# Patient Record
Sex: Male | Born: 1960 | Race: White | Hispanic: No | Marital: Married | Smoking: Never smoker
Health system: Southern US, Community
[De-identification: ages and names within clinical notes are randomized; demographics above are authoritative.]

## PROBLEM LIST (undated history)

## (undated) DIAGNOSIS — Z9889 Other specified postprocedural states: Secondary | ICD-10-CM

## (undated) DIAGNOSIS — R112 Nausea with vomiting, unspecified: Secondary | ICD-10-CM

## (undated) DIAGNOSIS — I10 Essential (primary) hypertension: Secondary | ICD-10-CM

## (undated) DIAGNOSIS — M1990 Unspecified osteoarthritis, unspecified site: Secondary | ICD-10-CM

## (undated) DIAGNOSIS — G8929 Other chronic pain: Secondary | ICD-10-CM

## (undated) DIAGNOSIS — M549 Dorsalgia, unspecified: Secondary | ICD-10-CM

## (undated) DIAGNOSIS — K219 Gastro-esophageal reflux disease without esophagitis: Secondary | ICD-10-CM

## (undated) DIAGNOSIS — J302 Other seasonal allergic rhinitis: Secondary | ICD-10-CM

## (undated) HISTORY — PX: KNEE ARTHROSCOPY: SUR90

## (undated) HISTORY — PX: PLANTAR FASCIA SURGERY: SHX746

## (undated) HISTORY — PX: SHOULDER ARTHROSCOPY: SHX128

## (undated) HISTORY — PX: REPAIR TENDONS FOOT: SUR1209

## (undated) HISTORY — PX: WRIST ARTHROSCOPY: SHX838

---

## 2000-11-05 ENCOUNTER — Ambulatory Visit (HOSPITAL_COMMUNITY): Admission: RE | Admit: 2000-11-05 | Discharge: 2000-11-05 | Payer: Self-pay | Admitting: Pulmonary Disease

## 2000-11-06 ENCOUNTER — Encounter (HOSPITAL_COMMUNITY): Admission: RE | Admit: 2000-11-06 | Discharge: 2000-12-06 | Payer: Self-pay | Admitting: Pulmonary Disease

## 2000-11-27 ENCOUNTER — Encounter (HOSPITAL_COMMUNITY): Admission: RE | Admit: 2000-11-27 | Discharge: 2000-12-27 | Payer: Self-pay | Admitting: Pulmonary Disease

## 2000-12-05 ENCOUNTER — Ambulatory Visit (HOSPITAL_COMMUNITY): Admission: RE | Admit: 2000-12-05 | Discharge: 2000-12-05 | Payer: Self-pay | Admitting: Pulmonary Disease

## 2001-01-22 ENCOUNTER — Encounter: Payer: Self-pay | Admitting: Otolaryngology

## 2001-01-22 ENCOUNTER — Ambulatory Visit (HOSPITAL_COMMUNITY): Admission: RE | Admit: 2001-01-22 | Discharge: 2001-01-22 | Payer: Self-pay | Admitting: Otolaryngology

## 2001-02-12 ENCOUNTER — Encounter: Payer: Self-pay | Admitting: Otolaryngology

## 2001-02-12 ENCOUNTER — Ambulatory Visit (HOSPITAL_COMMUNITY): Admission: RE | Admit: 2001-02-12 | Discharge: 2001-02-12 | Payer: Self-pay | Admitting: Otolaryngology

## 2004-01-12 ENCOUNTER — Ambulatory Visit (HOSPITAL_COMMUNITY): Admission: RE | Admit: 2004-01-12 | Discharge: 2004-01-12 | Payer: Self-pay | Admitting: Orthopedic Surgery

## 2004-01-12 ENCOUNTER — Ambulatory Visit (HOSPITAL_BASED_OUTPATIENT_CLINIC_OR_DEPARTMENT_OTHER): Admission: RE | Admit: 2004-01-12 | Discharge: 2004-01-12 | Payer: Self-pay | Admitting: Orthopedic Surgery

## 2004-03-23 ENCOUNTER — Ambulatory Visit (HOSPITAL_COMMUNITY): Admission: RE | Admit: 2004-03-23 | Discharge: 2004-03-23 | Payer: Self-pay | Admitting: Pulmonary Disease

## 2004-04-13 ENCOUNTER — Ambulatory Visit: Payer: Self-pay | Admitting: Internal Medicine

## 2004-05-03 ENCOUNTER — Ambulatory Visit: Payer: Self-pay | Admitting: Internal Medicine

## 2004-05-03 ENCOUNTER — Ambulatory Visit (HOSPITAL_COMMUNITY): Admission: RE | Admit: 2004-05-03 | Discharge: 2004-05-03 | Payer: Self-pay | Admitting: Internal Medicine

## 2004-07-30 ENCOUNTER — Emergency Department (HOSPITAL_COMMUNITY): Admission: EM | Admit: 2004-07-30 | Discharge: 2004-07-31 | Payer: Self-pay | Admitting: *Deleted

## 2005-01-10 ENCOUNTER — Ambulatory Visit (HOSPITAL_COMMUNITY): Admission: RE | Admit: 2005-01-10 | Discharge: 2005-01-10 | Payer: Self-pay | Admitting: Orthopedic Surgery

## 2005-01-10 ENCOUNTER — Ambulatory Visit (HOSPITAL_BASED_OUTPATIENT_CLINIC_OR_DEPARTMENT_OTHER): Admission: RE | Admit: 2005-01-10 | Discharge: 2005-01-10 | Payer: Self-pay | Admitting: Orthopedic Surgery

## 2006-02-07 ENCOUNTER — Ambulatory Visit (HOSPITAL_BASED_OUTPATIENT_CLINIC_OR_DEPARTMENT_OTHER): Admission: RE | Admit: 2006-02-07 | Discharge: 2006-02-07 | Payer: Self-pay | Admitting: Orthopedic Surgery

## 2006-12-11 ENCOUNTER — Encounter: Admission: RE | Admit: 2006-12-11 | Discharge: 2006-12-11 | Payer: Self-pay | Admitting: Otolaryngology

## 2006-12-27 ENCOUNTER — Ambulatory Visit: Payer: Self-pay | Admitting: Gastroenterology

## 2007-01-01 ENCOUNTER — Ambulatory Visit: Payer: Self-pay | Admitting: Gastroenterology

## 2007-01-01 ENCOUNTER — Ambulatory Visit (HOSPITAL_COMMUNITY): Admission: RE | Admit: 2007-01-01 | Discharge: 2007-01-01 | Payer: Self-pay | Admitting: Gastroenterology

## 2007-01-01 ENCOUNTER — Encounter: Payer: Self-pay | Admitting: Gastroenterology

## 2007-01-03 ENCOUNTER — Ambulatory Visit: Payer: Self-pay | Admitting: Gastroenterology

## 2007-01-14 ENCOUNTER — Ambulatory Visit (HOSPITAL_COMMUNITY): Admission: RE | Admit: 2007-01-14 | Discharge: 2007-01-14 | Payer: Self-pay | Admitting: Pulmonary Disease

## 2010-05-30 NOTE — Op Note (Signed)
NAME:  [name], [name] NO.:  [id number]   MEDICAL RECORD NO.:  [id number]          PATIENT TYPE:  AMB   LOCATION:  DAY                           FACILITY:  [facility]   PHYSICIAN:  Kassie Mends, M.D.      DATE OF BIRTH:  [birth year]   DATE OF PROCEDURE:  01/01/2007  DATE OF DISCHARGE:                               OPERATIVE REPORT   PROCEDURE:  Esophagogastroduodenoscopy with cold forceps biopsy and  Bravo capsule placement.   INDICATION FOR EXAM:  [name] is a 50 year old male who was seen  by Dr. Karilyn Cota in 2006 for a chronic cough that did not respond to  antibiotics.  He was placed on Nexium 40 mg once a day with no  improvement of his symptoms and the cough eventually went away. He  continued to have intermittent episodes of cough.  He had an EGD with  Bravo capsule placement in April 2006.  The study was performed off of  medications.  On day one, he had 174 episodes of reflux with 172 minutes  of his esophagus having a pH of less than 4.  His reflux was occurring  primarily in the upright position.  With cough, he only had one minute  of pH of less than 4 in his esophagus.  His DeMeester score was 40.4 on  day one.  On day two, he had 61 episodes of reflux.  He had 37 minutes  in which his esophagus pH was less than 4.  He had no episodes of a pH  less than 4 with cough.  His DeMeester score on day two was 8.7.  His  total number of episodes of reflux included 235.  His symptoms  correlated 73% of the time with chest pain, 97% of the time with cough  (only one episode), and 15.9% of the time with symptoms of  regurgitation.  He  was placed on omeprazole twice a day.  He presented  again in December 2008 with persistent cough once a year.  The cough can  be so bad that it makes him vomit.  He tried increasing his Nexium to  twice a day for two weeks and it did not make any difference.  He has no  known triggers for this cough.  He was not having any weight  loss.  The  EGD is being performed today on therapy.  It will be a 48-hour study.   FINDINGS:  1. Normal esophagus without evidence of Barrett's, mass, stricture,      erosion or ulceration.  The GE junction is at 40 cm.  2. Linear erythema of the antrum without erosion or ulceration.      Biopsies obtained via cold forceps to evaluate for H. pylori      gastritis.  No hiatal hernia.  3. Normal duodenal bulb, ampulla, and second portion of the duodenum.   DIAGNOSIS:  Gastritis.   RECOMMENDATIONS:  1. Will return in 48 hours with Bravo reader.  2. Continue outpatient meds for gastroesophageal reflux disease to      include Nexium and Tums.  3.  No aspirin, NSAIDs or anticoagulation for seven days.  4. Will call [name] with the results of his biopsy and his      Bravo study.  5. He already has a follow appointment to see me in six weeks.   MEDICATIONS:  1. Demerol 100 mg IV.  2. Versed 7 mg IV.   PROCEDURE TECHNIQUE:  Physical exam was performed.  Informed consent was  obtained from the patient after explaining the benefits, risks and  alternatives to the procedure.  The patient was connected to the monitor  and placed in left lateral position.  Continuous oxygen was provided by  nasal cannula and IV medicine administered through an indwelling  cannula.  After administration of sedation, the patient's esophagus was  intubated and the scope was advanced under direct visualization to the  second portion of the duodenum.  The scope was withdrawn slowly by  carefully examining the color, texture, anatomy and integrity of the  mucosa on the way out.  The GE junction was identified at 40 cm.  The  Bravo capsule was inserted to 34 cm from the teeth.  Suction was applied  and the device was released.  The patient's esophagus was again  intubated with the diagnostic gastroscope.  Placement of the capsule on  the sidewall of the esophagus was confirmed.  The scope was withdrawn.   The patient was recovered in endoscopy and discharged home in  satisfactory condition.      Kassie Mends, M.D.  Electronically Signed     SM/MEDQ  D:  01/01/2007  T:  01/01/2007  Job:  [id number]   cc:   Ramon Dredge L. Juanetta Gosling, M.D.  Fax: [phone]

## 2010-05-30 NOTE — Procedures (Signed)
NAME:  [name], CAPOZZI NO.:  [id number]   MEDICAL RECORD NO.:  [id number]          PATIENT TYPE:  OUT   LOCATION:  RESP                          FACILITY:  [facility]   PHYSICIAN:  Edward L. Juanetta Gosling, M.D.DATE OF BIRTH:  1960-01-24   DATE OF PROCEDURE:  DATE OF DISCHARGE:  01/14/2007                            PULMONARY FUNCTION TEST   1. Spirometry shows no ventilatory defect and no evidence of airflow      obstruction.  2. Lung volumes are normal.  3. DLCO is normal.  4. Arterial blood gases normal.      Edward L. Juanetta Gosling, M.D.  Electronically Signed     ELH/MEDQ  D:  01/22/2007  T:  01/22/2007  Job:  [id number]

## 2010-05-30 NOTE — Op Note (Signed)
NAME:  [name], [name] NO.:  [id number]   MEDICAL RECORD NO.:  [id number]          PATIENT TYPE:  AMB   LOCATION:  DAY                           FACILITY:  [facility]   PHYSICIAN:  Kassie Mends, M.D.      DATE OF BIRTH:  1960/11/23   DATE OF PROCEDURE:  01/03/2007  DATE OF DISCHARGE:  01/01/2007                               OPERATIVE REPORT   PROCEDURE:  Bravo capsule read-48 hr study   INDICATION FOR EXAM:  [name] is a 50 year old male who was seen  by Dr. Karilyn Cota in 2006 for chronic cough that did not respond to  antibiotics.  He was placed on 40 mg of Nexium with no improvement.  He  continued to have episodes of cough.  An EGD in April 2006 and a Bravo  study was performed off therapy.  His Bravo study was consistent with  gastroesophageal reflux disease.  His Bravo study was performed to  determine whether or not his cough could be related to breakthrough acid  exposure.   INTERPRETATION OF THE STUDY:  On day one [name] had 7 episodes of  reflux.  Only one episode lasted greater than 5 minutes.  The one  episode of reflux lasted approximately 7 minutes.  He spent 15 minutes  out of 24 hours at a pH less than 4.  His DeMeester score on day one was  4.2.  DeMeester normal is less than 14.72.   On day two he had 13 episodes of reflux.  No episode was greater than 5  minutes.  He spent 3 minutes with a pH less than 4.  His DeMeester score  on day two was 1.6.  He reported no episodes of cough.  All of his  episodes of reflux occurred in the upright position.   DIAGNOSES:  [name] has adequate acid suppression on Nexium 40 mg  daily.  The study is within normal limits for physiologic reflux.  He  does not need a Nissen fundoplication based on results of the Bravo pH  monitoring study.  Would consider additional workup for his persistent  cough.  Could consider repeating this study when his coughing is more  pronounced.      Kassie Mends,  M.D.  Electronically Signed     SM/MEDQ  D:  01/06/2007  T:  01/07/2007  Job:  [id number]   cc:   Ramon Dredge L. Juanetta Gosling, M.D.  Fax: [phone]

## 2010-05-30 NOTE — Consult Note (Signed)
NAME:  [name], MENDIOLA NO.:  [id number]   MEDICAL RECORD NO.:  [id number]          PATIENT TYPE:  AMB   LOCATION:  DAY                           FACILITY:  [facility]   PHYSICIAN:  Kassie Mends, M.D.      DATE OF BIRTH:  12-03-1960   DATE OF CONSULTATION:  12/27/2006  DATE OF DISCHARGE:                                 CONSULTATION   REFERRING PHYSICIAN:  Oneal Deputy. Juanetta Gosling, M.D.   REASON FOR CONSULTATION:  Evaluate for atypical reflux.   HISTORY OF PRESENT ILLNESS:  Mr. Boehning is a 50 year old male who has  problem with persistent cough once a year.  The coughing can be so  severe that it makes him vomit.  He has been tried on antibiotics,  steroid shots and oral steroids without any change in his symptoms.  He  tried the increasing his Nexium to twice a day x2 weeks and it did not  make any difference.  The cough occurs randomly.  It is not brought on  by cold air or laughing.  He denies any posterior nasal drip.  He had a  CT scan of the sinuses which was unremarkable, per his report.  He was  also tried on Hycodan, which did not help with his cough.  Chest x-ray  was negative.  He has never been on an antihistamines or had pulmonary  function tests.  He has not been on any inhalers.  He has a little  weight loss.  He really denies any abdominal pain.  He had some diarrhea  last week associated with a viral illness and he states his cough is a  little bit better.  He has been seen and evaluated by [facility] in [place] and they are wondering if his symptoms are related  to atypical reflux.   PAST MEDICAL HISTORY:  None.   PAST SURGICAL HISTORY:  1. Bilateral shoulder.  2. Bilateral knee surgery.  3. Sinus surgery.  4. Right wrist surgery.   FAMILY HISTORY:  He has no family history of colon cancer or colon  polyps.   SOCIAL HISTORY:  He is married and works as at [employer].  He has never  smoked.  He rarely drinks alcohol.   REVIEW OF  SYSTEMS:  Per the HPI, otherwise all systems negative.   ASSESSMENT:  Mr. Schroeter is a 50 year old male who could have atypical  reflux.   Thank you for allowing me to see Ms. Parcher in consultation.   RECOMMENDATIONS:  1. He will be scheduled for an EGD on next Wednesday.  He is to remain      on his Nexium and he can continue to take his Tums two nightly.  He      is not allergic to any medicines.  2. He has a follow up appointment to see me in 6 weeks.      Kassie Mends, M.D.  Electronically Signed     SM/MEDQ  D:  12/27/2006  T:  12/27/2006  Job:  [id number]   cc:   Ramon Dredge L. Juanetta Gosling, M.D.  Fax: [phone]

## 2010-06-02 NOTE — Op Note (Signed)
NAME:  [name], [name] NO.:  [id number]   MEDICAL RECORD NO.:  [id number]          PATIENT TYPE:  AMB   LOCATION:  DSC                          FACILITY:  [facility]   PHYSICIAN:  Loreta Ave, M.D. DATE OF BIRTH:  09/26/1960   DATE OF PROCEDURE:  02/07/2006  DATE OF DISCHARGE:                               OPERATIVE REPORT   PREOPERATIVE DIAGNOSIS:  Medial meniscus tear, chondromalacia patella  left knee.   POSTOPERATIVE DIAGNOSIS:  Medial meniscus tear, chondromalacia patella  left knee, with also some focal grade 3 chondromalacia medial femoral  condyle and a little grade 2 change of lateral tibial plateau.   PROCEDURE:  Left knee exam under anesthesia, arthroscopy with partial  medial meniscectomy.  Chondroplasty of the patellofemoral joint, as well  as medial femoral condyle.   SURGEON:  Loreta Ave, M.D.   ASSISTANT:  Genene Churn. Denton Meek.   ANESTHESIA:  Knee block with sedation.   SPECIMENS:  None.   CULTURES:  None.   COMPLICATIONS:  None.   DRESSINGS:  Soft compressive.   PROCEDURE:  The patient brought to the operating room and placed on the  operating room table in the supine position.  After adequate anesthesia  had been obtained, the left knee examined.  Good motion, good stability,  minimal crepitus.  Tourniquet and leg holder applied.  Leg prepped and  draped.  Three portals created.  One superolateral and one in each  medial and lateral parapatella.  Inflow catheter introduced.  The  standard arthroscope introduced and the knee inspected.  Good tracking.  Some focal grade 3 change.  Medial patella facet debrided.  The trochlea  had a central lesion about 1.5 cm grade 3, very deep, contacting patella  through most of range of motion, but fortunately it contained chondral  lesion.  Chondroplasty to the stable surface.  No exposed bone requiring  microfracturing.  Cruciate ligament is intact.  Lateral meniscus looked  good.  A little  grade 2 change on the plateau debrided.  Medially  extensive complex tearing medial meniscus.  Entire posterior half  removed, taken to a stable rim, tapered it to remaining meniscus.  Some  focal grade 3 change on the condyle less than a centimeter in diameter  debrided.  Not grade 4.  At completion the entire knee examined.  No  other significant findings appreciated.  Instruments and fluid removed.  The knee injected with Marcaine.  Portals closed with 4-0 nylon.  A sterile compressive dressing applied.  Anesthesia reversed.  Brought to recovery room.  Tolerated surgery well  with no complications.      Loreta Ave, M.D.  Electronically Signed     DFM/MEDQ  D:  02/07/2006  T:  02/07/2006  Job:  [id number]

## 2010-06-02 NOTE — Op Note (Signed)
NAME:  [name], [name] NO.:  [id number]   MEDICAL RECORD NO.:  [id number]          PATIENT TYPE:  AMB   LOCATION:  DAY                           FACILITY:  [facility]   PHYSICIAN:  Lionel December, M.D.    DATE OF BIRTH:  1960/07/14   DATE OF PROCEDURE:  05/03/2004  DATE OF DISCHARGE:                                 OPERATIVE REPORT   PROCEDURE:  Esophagogastroduodenoscopy with placement of Bravo device.   INDICATIONS:  Burak is a 50 year old Caucasian male with recurrent bouts of  coughing.  He has not responded to antibiotic therapy.  He was also treated  with Nexium with the thought that this was due to GERD but without  symptomatic improvement.  He is, therefore, undergoing EGD.  If he has no  endoscopic evidence of esophagitis, Bravo device would be placed for PH  study.  Procedure was reviewed with the patient and informed consent was  obtained.   PREMEDICATION:  Cetacaine spray for pharyngeal topical anesthesia, Demerol  50 mg IV, Versed 7 mg IV in divided doses.   FINDINGS:  The procedures were performed in the endoscopy suite.  The  patient's vital signs and O2 saturation were monitored during the procedure  and remained stable.   The patient was placed in the left lateral recumbent position and Olympus  video scope was passed through the oropharynx without any difficulty into  the esophagus.   Esophagus:  Mucosa of the esophagus was normal.  GE junction was at 45 mm  from the incisors.  No hernia was noted.   Stomach:  It was empty and distended very well on insufflation.  Folds of  the proximal stomach were normal.  Examination of the mucosa revealed linear  erythema and erosions at the antrum.  No ulcer crater was noted.  Pyloric  channel was patent.  Angularis, fundus and cardia were examined by  retroflexing the scope and were normal.   Duodenum:  Duodenum bulbar mucosa was normal.  Scope was passed to the  second part of the duodenum.  Mucosa and  folds were normal.   The endoscope was withdrawn.  Bravo device was loaded onto the probe.  It  was advanced blindly into the esophagus to 34 cm from the incisors.  It was  connected to a suction device for 30 seconds.  At this point plunger was  pushed in order to secure the Bravo device onto the esophageal mucosa.  Endoscope was passed again.  Plunger was rotated clockwise to disengage it.  Bravo device was withdrawn.  PH probe was in good position.  Pictures taken.  Endoscope is withdrawn.  The patient tolerated the procedure well.   FINAL DIAGNOSIS:  1.  No endoscopic evidence of reflux esophagitis.  2.  Bravo device placed for PH study.  3.  Antral erosive gastritis.  4.  Normal examination of the first and second part of the duodenum.   RECOMMENDATIONS:  Helicobacter pylori serology will be checked.  The patient  given instructions regarding record-keeping or diary.  He will return to  this facility in 48 hours.  NR/MEDQ  D:  05/03/2004  T:  05/03/2004  Job:  [id number]   cc:   Ramon Dredge L. Juanetta Gosling, M.D.  [address]  [place]  [place] [address]  Fax: [phone]

## 2010-06-02 NOTE — Op Note (Signed)
[name], [name]               ACCOUNT NO.:  [id number]   MEDICAL RECORD NO.:  [id number]            PATIENT TYPE:   LOCATION:                                 FACILITY:   PHYSICIAN:  Cindee Salt, M.D.            DATE OF BIRTH:   DATE OF PROCEDURE:  01/10/2005  DATE OF DISCHARGE:                                 OPERATIVE REPORT   PREOPERATIVE DIAGNOSIS:  Scapholunate ligament tear right wrist.   POSTOPERATIVE DIAGNOSIS:  Scapholunate ligament tear right wrist.   OPERATION:  Arthroscopy, debridement shrinkage scapholunate ligament grade 2  tear, right wrist.   SURGEON:  Cindee Salt, M.D.   ASSISTANT:  Carolyne Fiscal R.N.   ANESTHESIA:  General.   HISTORY:  The patient is a 50 year old male with a history of wrist pain. He  has had an MRI revealing a scapholunate ligament disruption. He is admitted  now for arthroscopy. After failure of conservative treatment.   DESCRIPTION OF PROCEDURE:  The patient is brought to the operating room  where a general anesthetic was carried out without difficulty.  He was  prepped using DuraPrep in the supine position, right arm free. The limb was  placed in the arthroscopy tower, 10 pounds of traction applied, the joint  inflated through the 3-4 portal. A transverse incision was made at 3-4  portal, deepened with the hemostat. Blunt trocar was used to enter the  joint. Care was taken to protect the extensor pollicis longus tendon. The  scope was introduced; the joint inspected; an irrigation catheter placed in  the 6-U portal with the 18-gauge needle  The joint was inspected. The volar  radial wrist ligaments were intact.  There were no articular changes on the  scaphoid or the distal radius either radial or lunate facet  Lunate showed  no changes scapholunate ligament tear was apparent. The ulnar side was  inspected.  A very small tear of the lunar triquetral joint was noted.  A 4-  5 portal was opened. This was opened with a transverse incision and  deepened  with a hemostat. A blunt trocar was used to enter the joint, again, the  joint inspected from the ulnar side. The LT injury was very small, no change  was present in the TFCC or the volar-ulnar wrist ligaments.  Midcarpal joint  was then inspected after localization with a 22-gauge needle; the radial  portal opened; and the scope introduced. The STT joint showed no change.  There were no changes on the capitate.  The distal articular surface of the  scapholunate triquetrum were on the hamate.  Moderate instability was  present to the scapholunate ligament complex, but a probe could be entered  indicative of a __________ , stage 2, scapholunate disruption. The scope was  reintroduced into the 4-5 portal. A full radius shaver introduced 3-4 and a  debridement of the scapholunate ligament proximal aspect performed. The  dorsal component appeared to be intact. Moderate synovitis was present.   An ArthroWand was then inserted. The synovitis was then removed with the  ArthroWand  and a shrinkage performed to the remainder of the scapholunate  ligament.  The midcarpal joint was, again, inspected.  The instability was  moderately corrected. There was still a small amount of motion present, but  did not seem, or appear to be excessive.  The instruments were removed. The  portals closed with interrupted 5-0 nylon sutures. A sterile  compressive dressing, thumb spica, and a dorsal palmar radial and ulnar  splint placed. The patient tolerated the procedure well was taken to the  recovery observation in satisfactory condition. He is discharged home to  return to the [facility] [place] in 1 week on Vicodin.           ______________________________  Cindee Salt, M.D.     GK/MEDQ  D:  01/10/2005  T:  01/10/2005  Job:  [id number]

## 2010-06-02 NOTE — Op Note (Signed)
NAME:  [name], [name] NO.:  [id number]   MEDICAL RECORD NO.:  [id number]          PATIENT TYPE:  AMB   LOCATION:  DSC                          FACILITY:  [facility]   PHYSICIAN:  Loreta Ave, M.D. 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   PROCEDURES:  1.  Exploration and debridement, right foot laterally.  2.  Repair of complete tear, short flexor tendon, and partial tear, peroneus      tendon, off base of fifth metatarsal, with drilling into the metatarsal      and repair with a bioabsorbable anchor and Fibrewire suture.   SURGEON:  Loreta Ave, M.D.   ASSISTANT:  Genene Churn. Denton Meek.   ANESTHESIA:  General.   ESTIMATED BLOOD LOSS:  Minimal.   TOURNIQUET TIME:  45 minutes.   SPECIMENS:  None.   CULTURES:  None.   COMPLICATIONS:  None.   DRESSING:  Soft compressive with a Cam walker.   PROCEDURE:  Patient brought to the operating room and placed on the  operating table in supine position.  After adequate anesthesia had been  obtained, tourniquet applied upper aspect of the right leg.  Prepped and  draped in the usual sterile fashion.  Exsanguinated with elevation and  Esmarch, tourniquet inflated to 350 mmHg.  An incision from the base of the  fifth metatarsal extending proximally.  Skin and subcutaneous tissue  divided.  Care taken to protect the sural nerve.  Base of the fifth  metatarsal exposed.  The entire short flexor torn off with marked mucinous  degeneration and inflammatory debris and tearing of the bottom portion of  the attachment of the peroneus  brevis tendon.  All of this debrided of  inflammatory debris and abnormal material.  Base of the fifth metatarsal  treated with debridement with a rongeur.  The canal was entered, yielding  thickened but noninfectious fluid but consistent with edema in the bone.  The wound irrigated.  I then placed a 3.5 mm bioabsorbable anchor into the  fifth metatarsal shaft.  The Fibrewire from that were then weaved into the  short flexor and peroneus brevis tendon from this, carrying it back to the  base of the fifth metatarsal.  Oversewn with Vicryl.  A nice, secure repair  with full passive  motion.  The wound irrigated and then closed with Vicryl and then nylon.  Margins of the wound injected with Marcaine.  A sterile compressive dressing  with a Cam walker applied.  The tourniquet deflated and removed.  Anesthesia  reversed.  Brought to the recovery room.  Tolerated the surgery well with no  complications.      Dani   DFM/MEDQ  D:  01/12/2004  T:  01/12/2004  Job:  [id number]

## 2010-06-02 NOTE — Consult Note (Signed)
NAME:  Timothy, Crane NO.:  [id number]   MEDICAL RECORD NO.:  [id number]          PATIENT TYPE:  AMB   LOCATION:                                FACILITY:  [facility]   PHYSICIAN:  Lionel December, M.D.    DATE OF BIRTH:  1961/01/09   DATE OF CONSULTATION:  04/13/2004  DATE OF DISCHARGE:                                   CONSULTATION   REASON FOR CONSULTATION:  Chronic cough.   HISTORY OF PRESENT ILLNESS:  [name] is a 50 year old Caucasian  gentleman who presents today for further evaluation of chronic cough.  He  has episodes of cough which may last up to three months at a time.  About a  year and a half ago he had cough for over three months and actually received  IV antibiotics for about 10 days as an outpatient with no improvement. He  states he had had workup for asthma, chest x-rays and at that time was even  on Nexium 40 mg daily with no improvement of his symptoms.  The cough  eventually went away.  He has intermittent bouts where he gets cough and may  last up to several days at a time but sometimes up to a couple of months.  Earlier this month this happened and he had a chest x-ray which revealed  mild diffuse peribronchial thickening without air-space consolidation.  He  has been on multiple rounds of antibiotics as well as shots which sound  like both antibiotic and steroid.  His cough has subsided somewhat at this  time.  He is currently not on any antireflux medications.  He notes also  that when he has these symptoms he gets to coughing so hard that he has  nausea and vomiting.  A few weeks ago he lost over 16 pounds in one week.  He also at times when he eats develops coughing afterwards.  Sometimes he  feels like he is choking when he is eating.  After he vomits several times  the cough actually improves.  Denies any typical heart burn.  Denies  dysphagia or odynophagia.  His bowels are moving regularly.  No abdominal  pain, melena or rectal  bleeding.  He has never had an EEG.  He denies any  prior chest CT or bronchoscopy.   CURRENT MEDICATIONS:  1.  Lexapro 10 mg daily.  2.  Multivitamins daily.   ALLERGIES:  No known drug allergies.   PAST MEDICAL HISTORY:  Depression.   PAST SURGICAL HISTORY:  He had repair of right foot tendon.  He has had a  right knee arthroscopy and bilateral shoulder arthroscopy.   FAMILY HISTORY:  His father had a history of lung cancer which apparently  metastasized to the brain.  No family history of colorectal cancer.   SOCIAL HISTORY:  He is currently separated.  He works at [employer].  He has  never been a smoker.  Both of his parents were smokers, however.  He rarely  consumes alcohol.   REVIEW OF SYSTEMS:  See HPI for GI.  See HPI for constitutional  and  cardiopulmonary.  In addition denies chest pain.   PHYSICAL EXAMINATION:  VITAL SIGNS:  Weight 185.5, height 5 feet 9 inches,  temperature 98.1, blood pressure 122/74, pulse 68.  GENERAL:  A pleasant well-nourished, well-developed Caucasian male in no  acute distress.  SKIN:  Warm, dry and no jaundice.  HEENT:  Conjunctivae are pink.  Sclerae are nonicteric.  Oropharyngeal  mucosa are moist and pink, no lesions, erythema or exudate.  No  lymphadenopathy or thyromegaly.  CHEST:  Lungs are clear to auscultation.  CARDIAC:  Regular rate and rhythm.  Normal S1 and S2.  No murmur, rub, or  gallop.  ABDOMEN:  Positive bowel sounds, soft, nontender, nondistended, no  organomegaly or masses.  No rebound tenderness or guarding.  EXTREMITIES:  No edema.   IMPRESSION:  [name] is a 50 year old gentleman who presents with episodic  chronic cough.  Coughing is so severe that he develops nausea, vomiting and  weight loss when it occurs.  The most recent chest x-ray revealed mild  bronchitic changes but no consolidation.  He does have a significant  postprandial component.  Would question if his symptoms are related to acid  reflux.  I  discussed the case with Dr. Dionicia Abler and we will plan on EGD with  possible Bravo test based on findings.   PLAN:  1.  EGD in the near future.  If endoscopically his esophagus appears normal      then Bravo probes will be placed to look for acid reflux.  2.  Further recommends to follow.      LL/MEDQ  D:  04/13/2004  T:  04/13/2004  Job:  [id number]   cc:   Ramon Dredge L. Juanetta Gosling, M.D.  [address]  [place]  [place] [address]  Fax: [phone]

## 2010-10-20 LAB — BLOOD GAS, ARTERIAL
Acid-Base Excess: 1.1
Bicarbonate: 25.5 — ABNORMAL HIGH
O2 Saturation: 95.8
pO2, Arterial: 79.2 — ABNORMAL LOW

## 2012-12-23 ENCOUNTER — Encounter (HOSPITAL_COMMUNITY): Payer: Self-pay | Admitting: Pharmacist

## 2012-12-30 NOTE — H&P (Signed)
[name] is an 52 y.o. male.   Chief Complaint: Immediate family history of colon cancer HPI: Patient is a 52 year old white male who presents for a screening colonoscopy. His mother was recently diagnosed with colon cancer and the patient now presents for colonoscopy. He denies any GI complaints. 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  No past medical history on file.  No past surgical history on file.  No family history on file. Social History:  has no tobacco, alcohol, and drug history on file.  Allergies:  Allergies  Allergen Reactions  . Cucumber Extract Anaphylaxis  . Other Anaphylaxis    cantaloupe  . Watermelon [Citrullus Vulgaris] Anaphylaxis    No prescriptions prior to admission    No results found for this or any previous visit (from the past 48 hour(s)). No results found.  Review of Systems  Respiratory: Negative.   Cardiovascular: Negative.   Gastrointestinal: Negative.   All other systems reviewed and are negative.    There were no vitals taken for this visit. Physical Exam  Constitutional: He is oriented to person, place, and time. He appears well-developed and well-nourished.  HENT:  Head: Normocephalic and atraumatic.  Neck: Timothy range of motion. Neck supple.  Cardiovascular: Timothy rate, regular rhythm and Timothy heart sounds.   Respiratory: Effort Timothy and breath sounds Timothy.  GI: Soft. Bowel sounds are Timothy. There is no tenderness.  Neurological: He is alert and oriented to person, place, and time.  Skin: Skin is warm and dry.     Assessment/Plan Impression: Screening colonoscopy, family history of colon cancer Plan: The patient scheduled for a screening colonoscopy on 01/06/2013. The risks and benefits of the procedure including bleeding and perforation were fully explained to the patient, who gave informed consent.  Pedro Oldenburg A 12/30/2012, 6:20 PM

## 2013-01-06 ENCOUNTER — Encounter (HOSPITAL_COMMUNITY): Payer: Self-pay

## 2013-01-06 ENCOUNTER — Ambulatory Visit (HOSPITAL_COMMUNITY)
Admission: RE | Admit: 2013-01-06 | Discharge: 2013-01-06 | Disposition: A | Discharge status: Home / Self Care | Payer: PRIVATE HEALTH INSURANCE | Source: Ambulatory Visit | Attending: General Surgery | Admitting: General Surgery

## 2013-01-06 ENCOUNTER — Encounter (HOSPITAL_COMMUNITY)
Admission: RE | Disposition: A | Discharge status: Home / Self Care | Payer: Self-pay | Source: Ambulatory Visit | Attending: General Surgery

## 2013-01-06 DIAGNOSIS — Z800 Family history of malignant neoplasm of digestive organs: Secondary | ICD-10-CM | POA: Insufficient documentation

## 2013-01-06 DIAGNOSIS — Z1211 Encounter for screening for malignant neoplasm of colon: Secondary | ICD-10-CM | POA: Insufficient documentation

## 2013-01-06 HISTORY — DX: Gastro-esophageal reflux disease without esophagitis: K21.9

## 2013-01-06 HISTORY — DX: Other specified postprocedural states: Z98.890

## 2013-01-06 HISTORY — DX: Nausea with vomiting, unspecified: R11.2

## 2013-01-06 HISTORY — PX: COLONOSCOPY: SHX5424

## 2013-01-06 SURGERY — COLONOSCOPY
Anesthesia: Moderate Sedation

## 2013-01-06 MED ORDER — SODIUM CHLORIDE 0.9 % IV SOLN
INTRAVENOUS | Status: DC
  Administered 2013-01-06: 08:00:00 via INTRAVENOUS

## 2013-01-06 MED ORDER — MEPERIDINE HCL 50 MG/ML IJ SOLN
INTRAMUSCULAR | Status: DC | PRN
  Administered 2013-01-06: 50 mg via INTRAVENOUS

## 2013-01-06 MED ORDER — STERILE WATER FOR IRRIGATION IR SOLN
Status: DC | PRN
  Administered 2013-01-06: 09:00:00

## 2013-01-06 MED ORDER — MIDAZOLAM HCL 5 MG/5ML IJ SOLN
INTRAMUSCULAR | Status: AC
  Filled 2013-01-06: qty 5

## 2013-01-06 MED ORDER — MEPERIDINE HCL 50 MG/ML IJ SOLN
INTRAMUSCULAR | Status: AC
  Filled 2013-01-06: qty 1

## 2013-01-06 MED ORDER — MIDAZOLAM HCL 5 MG/5ML IJ SOLN
INTRAMUSCULAR | Status: DC | PRN
  Administered 2013-01-06: 4 mg via INTRAVENOUS
  Administered 2013-01-06: 1 mg via INTRAVENOUS

## 2013-01-06 MED ORDER — SILVER NITRATE-POT NITRATE 75-25 % EX MISC
CUTANEOUS | Status: DC | PRN
  Administered 2013-01-06: 1 via TOPICAL

## 2013-01-06 NOTE — Op Note (Signed)
[facility] [address] [place], [address]   COLONOSCOPY PROCEDURE REPORT  PATIENT: Timothy, Crane  MR#: [id number] BIRTHDATE: [birth year] , 52  yrs. old GENDER: Male ENDOSCOPIST: Franky Macho, MD REFERRED XB:[id number], Edward PROCEDURE DATE:  01/06/2013 PROCEDURE:   Colonoscopy, screening ASA CLASS:   Class I INDICATIONS:Patient's immediate family history of colon cancer. MEDICATIONS: Versed 5 mg IV and Demerol 50 mg IV  DESCRIPTION OF PROCEDURE:   After the risks benefits and alternatives of the procedure were thoroughly explained, informed consent was obtained.  A digital rectal exam revealed a perianal fistula.   The EC-[id number] ([id number])  endoscope was introduced through the anus and advanced to the cecum, which was identified by both the appendix and ileocecal valve. No adverse events experienced.   The quality of the prep was adequate, using MoviPrep The instrument was then slowly withdrawn as the colon was fully examined.      COLON FINDINGS: A normal appearing cecum, ileocecal valve, and appendiceal orifice were identified.  The ascending, hepatic flexure, transverse, splenic flexure, descending, sigmoid colon and rectum appeared unremarkable.  No polyps or cancers were seen. Retroflexed views revealed no abnormalities. The time to cecum=3 minutes 0 seconds.  Withdrawal time=6 minutes 0 seconds.  The scope was withdrawn and the procedure completed. COMPLICATIONS: There were no complications.  ENDOSCOPIC IMPRESSION: 1.   Normal colon 2.   No fistula into rectum seen.  RECOMMENDATIONS: Repeat Colonoscopy in 5 years.   eSigned:  Franky Macho, MD 01/06/2013 8:55 AM   cc:

## 2013-01-06 NOTE — Interval H&P Note (Signed)
History and Physical Interval Note:  01/06/2013 8:19 AM  [name]  has presented today for surgery, with the diagnosis of screening, f/h colon cancer  The various methods of treatment have been discussed with the patient and family. After consideration of risks, benefits and other options for treatment, the patient has consented to  Procedure(s): COLONOSCOPY (N/A) as a surgical intervention .  The patient's history has been reviewed, patient examined, no change in status, stable for surgery.  I have reviewed the patient's chart and labs.  Questions were answered to the patient's satisfaction.     [name]

## 2013-01-13 ENCOUNTER — Encounter (HOSPITAL_COMMUNITY): Payer: Self-pay | Admitting: General Surgery

## 2013-08-31 ENCOUNTER — Encounter (HOSPITAL_COMMUNITY): Payer: Self-pay | Admitting: Pharmacy Technician

## 2013-09-14 ENCOUNTER — Encounter (HOSPITAL_COMMUNITY): Payer: Self-pay

## 2013-09-14 ENCOUNTER — Encounter (HOSPITAL_COMMUNITY)
Admission: RE | Admit: 2013-09-14 | Discharge: 2013-09-14 | Disposition: A | Discharge status: Home / Self Care | Payer: PRIVATE HEALTH INSURANCE | Source: Ambulatory Visit | Attending: General Surgery | Admitting: General Surgery

## 2013-09-14 DIAGNOSIS — Z01818 Encounter for other preprocedural examination: Secondary | ICD-10-CM | POA: Diagnosis present

## 2013-09-14 DIAGNOSIS — K612 Anorectal abscess: Secondary | ICD-10-CM | POA: Diagnosis not present

## 2013-09-14 LAB — BASIC METABOLIC PANEL
Anion gap: 12 (ref 5–15)
BUN: 17 mg/dL (ref 6–23)
CALCIUM: 9.4 mg/dL (ref 8.4–10.5)
CO2: 26 meq/L (ref 19–32)
CREATININE: 0.92 mg/dL (ref 0.50–1.35)
Chloride: 104 mEq/L (ref 96–112)
GFR calc Af Amer: >90 mL/min (ref >90)
GLUCOSE: 102 mg/dL — AB (ref 70–99)
Potassium: 3.9 mEq/L (ref 3.7–5.3)
SODIUM: 142 meq/L (ref 137–147)

## 2013-09-14 LAB — CBC WITH DIFFERENTIAL/PLATELET
BASOS PCT: 0 % (ref 0–1)
Basophils Absolute: 0 10*3/uL (ref 0.0–0.1)
Eosinophils Absolute: 0.1 10*3/uL (ref 0.0–0.7)
Eosinophils Relative: 2 % (ref 0–5)
HEMATOCRIT: 39.6 % (ref 39.0–52.0)
Hemoglobin: 14.2 g/dL (ref 13.0–17.0)
LYMPHS PCT: 28 % (ref 12–46)
Lymphs Abs: 1.5 10*3/uL (ref 0.7–4.0)
MCH: 29.5 pg (ref 26.0–34.0)
MCHC: 35.9 g/dL (ref 30.0–36.0)
MCV: 82.3 fL (ref 78.0–100.0)
MONO ABS: 0.4 10*3/uL (ref 0.1–1.0)
MONOS PCT: 7 % (ref 3–12)
NEUTROS ABS: 3.4 10*3/uL (ref 1.7–7.7)
Neutrophils Relative %: 63 % (ref 43–77)
Platelets: 187 10*3/uL (ref 150–400)
RBC: 4.81 MIL/uL (ref 4.22–5.81)
RDW: 12.8 % (ref 11.5–15.5)
WBC: 5.4 10*3/uL (ref 4.0–10.5)

## 2013-09-14 NOTE — H&P (Signed)
[name] is an 53 y.o. male.   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  Past Medical History  Diagnosis Date  . PONV (postoperative nausea and vomiting)   . GERD (gastroesophageal reflux disease)     Past Surgical History  Procedure Laterality Date  . Colonoscopy N/Crane 01/06/2013    Procedure: COLONOSCOPY;  Surgeon: Dalia Heading, MD;  Location: [facility];  Service: Gastroenterology;  Laterality: N/Crane;    No family history on file. Social History:  has no tobacco, alcohol, and drug history on file.  Allergies:  Allergies  Allergen Reactions  . Cucumber Extract Anaphylaxis  . Other Anaphylaxis    cantaloupe  . Watermelon [Citrullus Vulgaris] Anaphylaxis    No prescriptions prior to admission    No results found for this or any previous visit (from the past 48 hour(s)). No results found.  Review of Systems  All other systems reviewed and are negative.   There were no vitals taken for this visit. Physical Exam  Constitutional: He is oriented to person, place, and time. He appears well-developed and well-nourished.  HENT:  Head: Normocephalic and atraumatic.  Neck: Normal range of motion. Neck supple.  Cardiovascular: Normal rate, regular rhythm and normal heart sounds.   Respiratory: Effort normal and breath sounds normal.  GI: Soft. Bowel sounds are normal.  Genitourinary:  Granulomatous tissue in the posterior perianal region with mild induration.  Neurological: He is alert and oriented to person, place, and time.  Skin: Skin is warm and dry.     Assessment/Plan Impression: Perianal abscess, granuloma Plan: Patient is scheduled for incision and drainage of the perianal abscess on 09/18/2013. The risks and benefits of the procedure including bleeding,  infection, and recurrence of the granuloma were fully explained to the patient, who gave informed consent.  [name] 09/14/2013, 9:37 AM

## 2013-09-14 NOTE — Pre-Procedure Instructions (Signed)
Patient given information to sign up for my chart at home. 

## 2013-09-14 NOTE — Patient Instructions (Signed)
[name]  09/14/2013   Your procedure is scheduled on:  09/18/2013  Report to [facility] at  615  AM.  Call this number if you have problems the morning of surgery: [phone]   Remember:   Do not eat food or drink liquids after midnight.   Take these medicines the morning of surgery with A SIP OF WATER:  Zyrtyec, prilosec   Do not wear jewelry, make-up or nail polish.  Do not wear lotions, powders, or perfumes.   Do not shave 48 hours prior to surgery. Men may shave face and neck.  Do not bring valuables to the hospital.  [facility] is not responsible for any belongings or valuables.               Contacts, dentures or bridgework may not be worn into surgery.  Leave suitcase in the car. After surgery it may be brought to your room.  For patients admitted to the hospital, discharge time is determined by your treatment team.               Patients discharged the day of surgery will not be allowed to drive home.  Name and phone number of your driver: family  Special Instructions: Shower using CHG 2 nights before surgery and the night before surgery.  If you shower the day of surgery use CHG.  Use special wash - you have one bottle of CHG for all showers.  You should use approximately 1/3 of the bottle for each shower.   Please read over the following fact sheets that you were given: Pain Booklet, Coughing and Deep Breathing, Surgical Site Infection Prevention, Anesthesia Post-op Instructions and Care and Recovery After Surgery Peri-Rectal Abscess Your caregiver has diagnosed you as having a peri-rectal abscess. This is an infected area near the rectum that is filled with pus. If the abscess is near the surface of the skin, your caregiver may open (incise) the area and drain the pus. HOME CARE INSTRUCTIONS   If your abscess was opened up and drained. A small piece of gauze may be placed in the opening so that it can drain. Do not remove the gauze unless directed by your  caregiver.  A loose dressing may be placed over the abscess site. Change the dressing as often as necessary to keep it clean and dry.  After the drain is removed, the area may be washed with a gentle antiseptic (soap) four times per day.  A warm sitz bath, warm packs or heating pad may be used for pain relief, taking care not to burn yourself.  Return for a wound check in 1 day or as directed.  An "inflatable doughnut" may be used for sitting with added comfort. These can be purchased at a drugstore or medical supply house.  To reduce pain and straining with bowel movements, eat a high fiber diet with plenty of fruits and vegetables. Use stool softeners as recommended by your caregiver. This is especially important if narcotic type pain medications were prescribed as these may cause marked constipation.  Only take over-the-counter or prescription medicines for pain, discomfort, or fever as directed by your caregiver. SEEK IMMEDIATE MEDICAL CARE IF:   You have increasing pain that is not controlled by medication.  There is increased inflammation (redness), swelling, bleeding, or drainage from the area.  An oral temperature above 102 F (38.9 C) develops.  You develop chills or generalized malaise (feel lethargic or feel "  washed out").  You develop any new symptoms (problems) you feel may be related to your present problem. Document Released: 12/30/1999 Document Revised: 03/26/2011 Document Reviewed: 12/30/2007 [facility] Patient Information 2015 [place], [place]. This information is not intended to replace advice given to you by your health care provider. Make sure you discuss any questions you have with your health care provider. Incision and Drainage Incision and drainage is a procedure in which a sac-like structure (cystic structure) is opened and drained. The area to be drained usually contains material such as pus, fluid, or blood.  LET YOUR CAREGIVER KNOW ABOUT:   Allergies to  medicine.  Medicines taken, including vitamins, herbs, eyedrops, over-the-counter medicines, and creams.  Use of steroids (by mouth or creams).  Previous problems with anesthetics or numbing medicines.  History of bleeding problems or blood clots.  Previous surgery.  Other health problems, including diabetes and kidney problems.  Possibility of pregnancy, if this applies. RISKS AND COMPLICATIONS  Pain.  Bleeding.  Scarring.  Infection. BEFORE THE PROCEDURE  You may need to have an ultrasound or other imaging tests to see how large or deep your cystic structure is. Blood tests may also be used to determine if you have an infection or how severe the infection is. You may need to have a tetanus shot. PROCEDURE  The affected area is cleaned with a cleaning fluid. The cyst area will then be numbed with a medicine (local anesthetic). A small incision will be made in the cystic structure. A syringe or catheter may be used to drain the contents of the cystic structure, or the contents may be squeezed out. The area will then be flushed with a cleansing solution. After cleansing the area, it is often gently packed with a gauze or another wound dressing. Once it is packed, it will be covered with gauze and tape or some other type of wound dressing. AFTER THE PROCEDURE   Often, you will be allowed to go home right after the procedure.  You may be given antibiotic medicine to prevent or heal an infection.  If the area was packed with gauze or some other wound dressing, you will likely need to come back in 1 to 2 days to get it removed.  The area should heal in about 14 days. Document Released: 06/27/2000 Document Revised: 07/03/2011 Document Reviewed: 02/26/2011 [facility] Patient Information 2015 [place], [place]. This information is not intended to replace advice given to you by your health care provider. Make sure you discuss any questions you have with your health care provider. PATIENT  INSTRUCTIONS POST-ANESTHESIA  IMMEDIATELY FOLLOWING SURGERY:  Do not drive or operate machinery for the first twenty four hours after surgery.  Do not make any important decisions for twenty four hours after surgery or while taking narcotic pain medications or sedatives.  If you develop intractable nausea and vomiting or a severe headache please notify your doctor immediately.  FOLLOW-UP:  Please make an appointment with your surgeon as instructed. You do not need to follow up with anesthesia unless specifically instructed to do so.  WOUND CARE INSTRUCTIONS (if applicable):  Keep a dry clean dressing on the anesthesia/puncture wound site if there is drainage.  Once the wound has quit draining you may leave it open to air.  Generally you should leave the bandage intact for twenty four hours unless there is drainage.  If the epidural site drains for more than 36-48 hours please call the anesthesia department.  QUESTIONS?:  Please feel free to call  your physician or the hospital operator if you have any questions, and they will be happy to assist you.

## 2013-09-18 ENCOUNTER — Encounter (HOSPITAL_COMMUNITY): Payer: Self-pay | Admitting: *Deleted

## 2013-09-18 ENCOUNTER — Ambulatory Visit (HOSPITAL_COMMUNITY): Payer: PRIVATE HEALTH INSURANCE | Admitting: Anesthesiology

## 2013-09-18 ENCOUNTER — Ambulatory Visit (HOSPITAL_COMMUNITY)
Admission: RE | Admit: 2013-09-18 | Discharge: 2013-09-18 | Disposition: A | Discharge status: Home / Self Care | Payer: PRIVATE HEALTH INSURANCE | Source: Ambulatory Visit | Attending: General Surgery | Admitting: General Surgery

## 2013-09-18 ENCOUNTER — Encounter (HOSPITAL_COMMUNITY): Payer: PRIVATE HEALTH INSURANCE | Admitting: Anesthesiology

## 2013-09-18 ENCOUNTER — Encounter (HOSPITAL_COMMUNITY)
Admission: RE | Disposition: A | Discharge status: Home / Self Care | Payer: Self-pay | Source: Ambulatory Visit | Attending: General Surgery

## 2013-09-18 DIAGNOSIS — K612 Anorectal abscess: Secondary | ICD-10-CM | POA: Insufficient documentation

## 2013-09-18 DIAGNOSIS — K219 Gastro-esophageal reflux disease without esophagitis: Secondary | ICD-10-CM | POA: Insufficient documentation

## 2013-09-18 HISTORY — PX: INCISION AND DRAINAGE ABSCESS: SHX5864

## 2013-09-18 SURGERY — INCISION AND DRAINAGE, ABSCESS
Anesthesia: General | Site: Rectum

## 2013-09-18 MED ORDER — LIDOCAINE VISCOUS 2 % MT SOLN
OROMUCOSAL | Status: AC
  Filled 2013-09-18: qty 15

## 2013-09-18 MED ORDER — DEXAMETHASONE SODIUM PHOSPHATE 4 MG/ML IJ SOLN
INTRAMUSCULAR | Status: AC
  Filled 2013-09-18: qty 1

## 2013-09-18 MED ORDER — DEXAMETHASONE SODIUM PHOSPHATE 4 MG/ML IJ SOLN
4.0000 mg | Freq: Once | INTRAMUSCULAR | Status: AC
  Administered 2013-09-18: 4 mg via INTRAVENOUS

## 2013-09-18 MED ORDER — MIDAZOLAM HCL 2 MG/2ML IJ SOLN
INTRAMUSCULAR | Status: AC
  Filled 2013-09-18: qty 2

## 2013-09-18 MED ORDER — MIDAZOLAM HCL 2 MG/2ML IJ SOLN
1.0000 mg | INTRAMUSCULAR | Status: DC | PRN
  Administered 2013-09-18: 2 mg via INTRAVENOUS

## 2013-09-18 MED ORDER — FENTANYL CITRATE 0.05 MG/ML IJ SOLN
INTRAMUSCULAR | Status: DC | PRN
  Administered 2013-09-18 (×2): 100 ug via INTRAVENOUS
  Administered 2013-09-18: 50 ug via INTRAVENOUS

## 2013-09-18 MED ORDER — BUPIVACAINE HCL (PF) 0.5 % IJ SOLN
INTRAMUSCULAR | Status: DC | PRN
  Administered 2013-09-18: 4 mL

## 2013-09-18 MED ORDER — ONDANSETRON HCL 4 MG/2ML IJ SOLN: 4.0000 mg | Freq: Once | INTRAMUSCULAR | Status: DC | PRN

## 2013-09-18 MED ORDER — LACTATED RINGERS IV SOLN
INTRAVENOUS | Status: DC
  Administered 2013-09-18: 07:00:00 via INTRAVENOUS

## 2013-09-18 MED ORDER — FENTANYL CITRATE 0.05 MG/ML IJ SOLN
INTRAMUSCULAR | Status: AC
  Filled 2013-09-18: qty 5

## 2013-09-18 MED ORDER — SODIUM CHLORIDE 0.9 % IR SOLN
Status: DC | PRN
  Administered 2013-09-18: 1000 mL

## 2013-09-18 MED ORDER — FENTANYL CITRATE 0.05 MG/ML IJ SOLN: 25.0000 ug | INTRAMUSCULAR | Status: AC

## 2013-09-18 MED ORDER — ONDANSETRON HCL 4 MG/2ML IJ SOLN
INTRAMUSCULAR | Status: AC
  Filled 2013-09-18: qty 2

## 2013-09-18 MED ORDER — SCOPOLAMINE 1 MG/3DAYS TD PT72
1.0000 | MEDICATED_PATCH | Freq: Once | TRANSDERMAL | Status: DC
Start: 2013-09-18 — End: 2013-09-18
  Administered 2013-09-18: 1.5 mg via TRANSDERMAL

## 2013-09-18 MED ORDER — ONDANSETRON HCL 4 MG/2ML IJ SOLN
4.0000 mg | Freq: Once | INTRAMUSCULAR | Status: AC
  Administered 2013-09-18: 4 mg via INTRAVENOUS

## 2013-09-18 MED ORDER — PROPOFOL 10 MG/ML IV EMUL
INTRAVENOUS | Status: AC
  Filled 2013-09-18: qty 20

## 2013-09-18 MED ORDER — METRONIDAZOLE IN NACL 5-0.79 MG/ML-% IV SOLN
INTRAVENOUS | Status: AC
  Filled 2013-09-18: qty 100

## 2013-09-18 MED ORDER — GLYCOPYRROLATE 0.2 MG/ML IJ SOLN
0.2000 mg | Freq: Once | INTRAMUSCULAR | Status: AC
  Administered 2013-09-18: 0.2 mg via INTRAVENOUS

## 2013-09-18 MED ORDER — FENTANYL CITRATE 0.05 MG/ML IJ SOLN
INTRAMUSCULAR | Status: AC
  Filled 2013-09-18: qty 2

## 2013-09-18 MED ORDER — LIDOCAINE HCL 1 % IJ SOLN
INTRAMUSCULAR | Status: DC | PRN
  Administered 2013-09-18: 50 mg via INTRADERMAL

## 2013-09-18 MED ORDER — MIDAZOLAM HCL 5 MG/5ML IJ SOLN
INTRAMUSCULAR | Status: DC | PRN
  Administered 2013-09-18: 2 mg via INTRAVENOUS

## 2013-09-18 MED ORDER — FENTANYL CITRATE 0.05 MG/ML IJ SOLN
25.0000 ug | INTRAMUSCULAR | Status: DC | PRN
Start: 2013-09-18 — End: 2013-09-18

## 2013-09-18 MED ORDER — CHLORHEXIDINE GLUCONATE 4 % EX LIQD: 1.0000 "application " | Freq: Once | CUTANEOUS | Status: DC

## 2013-09-18 MED ORDER — SCOPOLAMINE 1 MG/3DAYS TD PT72
MEDICATED_PATCH | TRANSDERMAL | Status: AC
  Filled 2013-09-18: qty 1

## 2013-09-18 MED ORDER — BUPIVACAINE HCL (PF) 0.5 % IJ SOLN
INTRAMUSCULAR | Status: AC
  Filled 2013-09-18: qty 30

## 2013-09-18 MED ORDER — HYDROCODONE-ACETAMINOPHEN 5-325 MG PO TABS: 1.0000 | ORAL_TABLET | ORAL | Status: AC | PRN

## 2013-09-18 MED ORDER — METRONIDAZOLE IN NACL 5-0.79 MG/ML-% IV SOLN
500.0000 mg | INTRAVENOUS | Status: AC
  Administered 2013-09-18: .5 g via INTRAVENOUS

## 2013-09-18 MED ORDER — GLYCOPYRROLATE 0.2 MG/ML IJ SOLN
INTRAMUSCULAR | Status: AC
  Filled 2013-09-18: qty 1

## 2013-09-18 MED ORDER — PROPOFOL 10 MG/ML IV BOLUS
INTRAVENOUS | Status: DC | PRN
  Administered 2013-09-18: 160 mg via INTRAVENOUS

## 2013-09-18 MED ORDER — KETOROLAC TROMETHAMINE 30 MG/ML IJ SOLN
30.0000 mg | Freq: Once | INTRAMUSCULAR | Status: AC
  Administered 2013-09-18: 30 mg via INTRAVENOUS
  Filled 2013-09-18: qty 1

## 2013-09-18 SURGICAL SUPPLY — 27 items
BAG HAMPER (MISCELLANEOUS) ×3 IMPLANT
BLADE SURG 15 STRL LF DISP TIS (BLADE) ×1 IMPLANT
BLADE SURG 15 STRL SS (BLADE) ×2
BNDG CONFORM 2 STRL LF (GAUZE/BANDAGES/DRESSINGS) ×3 IMPLANT
CLOTH BEACON ORANGE TIMEOUT ST (SAFETY) ×3 IMPLANT
COVER LIGHT HANDLE STERIS (MISCELLANEOUS) ×6 IMPLANT
ELECT REM PT RETURN 9FT ADLT (ELECTROSURGICAL) ×3
ELECTRODE REM PT RTRN 9FT ADLT (ELECTROSURGICAL) ×1 IMPLANT
GAUZE SPONGE 4X4 12PLY STRL (GAUZE/BANDAGES/DRESSINGS) IMPLANT
GLOVE BIOGEL M STRL SZ7.5 (GLOVE) ×3 IMPLANT
GLOVE BIOGEL PI IND STRL 7.5 (GLOVE) ×1 IMPLANT
GLOVE BIOGEL PI INDICATOR 7.5 (GLOVE) ×2
GLOVE EXAM NITRILE LRG STRL (GLOVE) ×3 IMPLANT
GLOVE SURG SS PI 7.5 STRL IVOR (GLOVE) ×3 IMPLANT
GOWN STRL REUS W/TWL LRG LVL3 (GOWN DISPOSABLE) ×6 IMPLANT
KIT ROOM TURNOVER APOR (KITS) ×3 IMPLANT
MANIFOLD NEPTUNE II (INSTRUMENTS) ×3 IMPLANT
MARKER SKIN DUAL TIP RULER LAB (MISCELLANEOUS) ×3 IMPLANT
NEEDLE HYPO 25X1 1.5 SAFETY (NEEDLE) ×3 IMPLANT
NS IRRIG 1000ML POUR BTL (IV SOLUTION) ×3 IMPLANT
PACK MINOR (CUSTOM PROCEDURE TRAY) ×3 IMPLANT
PAD ARMBOARD 7.5X6 YLW CONV (MISCELLANEOUS) ×3 IMPLANT
SET BASIN LINEN APH (SET/KITS/TRAYS/PACK) ×3 IMPLANT
SHEET LAVH (DRAPES) ×3 IMPLANT
SUT MNCRL AB 4-0 PS2 18 (SUTURE) ×3 IMPLANT
SYR BULB IRRIGATION 50ML (SYRINGE) ×3 IMPLANT
SYR CONTROL 10ML LL (SYRINGE) ×3 IMPLANT

## 2013-09-18 NOTE — Anesthesia Preprocedure Evaluation (Addendum)
Anesthesia Evaluation  Patient identified by MRN, date of birth, ID band Patient awake    Reviewed: Allergy & Precautions, H&P , NPO status , Patient's Chart, lab work & pertinent test results  History of Anesthesia Complications (+) PONV and history of anesthetic complications  Airway Mallampati: I TM Distance: >3 FB     Dental  (+) Teeth Intact   Pulmonary neg pulmonary ROS,  breath sounds clear to auscultation        Cardiovascular negative cardio ROS  Rhythm:Regular Rate:Normal     Neuro/Psych    GI/Hepatic   Endo/Other    Renal/GU      Musculoskeletal   Abdominal   Peds  Hematology   Anesthesia Other Findings   Reproductive/Obstetrics                           Anesthesia Physical Anesthesia Plan  ASA: I  Anesthesia Plan: General   Post-op Pain Management:    Induction: Intravenous  Airway Management Planned: LMA  Additional Equipment:   Intra-op Plan:   Post-operative Plan: Extubation in OR  Informed Consent: I have reviewed the patients History and Physical, chart, labs and discussed the procedure including the risks, benefits and alternatives for the proposed anesthesia with the patient or authorized representative who has indicated his/her understanding and acceptance.     Plan Discussed with:   Anesthesia Plan Comments:         Anesthesia Quick Evaluation

## 2013-09-18 NOTE — Discharge Instructions (Signed)
Keep area clean and dry.  No soaking in tub.

## 2013-09-18 NOTE — Anesthesia Procedure Notes (Signed)
Procedure Name: LMA Insertion Date/Time: 09/18/2013 7:45 AM Performed by: Despina Hidden Pre-anesthesia Checklist: Emergency Drugs available, Patient identified, Suction available and Patient being monitored Patient Re-evaluated:Patient Re-evaluated prior to inductionOxygen Delivery Method: Circle system utilized Preoxygenation: Pre-oxygenation with 100% oxygen Intubation Type: IV induction Ventilation: Mask ventilation without difficulty LMA: LMA inserted LMA Size: 4.0 Grade View: Grade I Tube type: Oral Number of attempts: 1 Placement Confirmation: positive ETCO2 and breath sounds checked- equal and bilateral Tube secured with: Tape Dental Injury: Teeth and Oropharynx as per pre-operative assessment

## 2013-09-18 NOTE — Op Note (Signed)
Patient:  DECKARD STUBER  DOB:  08-29-1960  MRN:  [id number]   Preop Diagnosis:  Perirectal abscess  Postop Diagnosis:  Same, granuloma  Procedure:  Incision and debridement of perirectal abscess granuloma  Surgeon:  Franky Macho, M.D.  Anes:  General  Indications:  Patient is a 52 year old white male who previously had a perirectal abscess he continues to have intermittent drainage from a punctate wound along the posterior aspect of the perianal region. The risks and benefits of the procedure including bleeding, infection, possibly recurrence were fully explained to the patient, who gave informed consent.  Procedure note:  The patient was placed in the lithotomy position after general anesthesia was administered. The perineum was prepped and draped using usual sterile technique with Betadine. Surgical site confirmation was performed.  A granulomatous lesion was noted at the entrance of the previously draining fistula. This granulomatous tissue was debrided. A probe was then used and a small, was extending towards the rectum, but never entered the rectum. This fistulous tract was curetted. A bleeding was controlled using Bovie electrocautery. 0.5% Sensorcaine was instilled the surrounding wound. Soft tissue was then reapproximated using 4-0 Monocryl interrupted sutures. A dry sterile dressing was then applied.  All tape and needle counts were correct at the end of the procedure. Patient was awakened and transferred to PACU in stable condition.  Complications:  None  EBL:  Minimal  Specimen:  None

## 2013-09-18 NOTE — Anesthesia Postprocedure Evaluation (Signed)
  Anesthesia Post-op Note  Patient: [name]  Procedure(s) Performed: Procedure(s): INCISION AND DRAINAGE PERI-RECTAL ABSCESS (N/A)  Patient Location: PACU  Anesthesia Type:General  Level of Consciousness: awake, alert , oriented and patient cooperative  Airway and Oxygen Therapy: Patient Spontanous Breathing  Post-op Pain: 2 /10, mild  Post-op Assessment: Post-op Vital signs reviewed, Patient's Cardiovascular Status Stable, Respiratory Function Stable, Patent Airway and Pain level controlled  Post-op Vital Signs: Reviewed and stable  Last Vitals:  Filed Vitals:   09/18/13 0646  BP:   Temp: 36.6 C  Resp:     Complications: No apparent anesthesia complications

## 2013-09-18 NOTE — Transfer of Care (Signed)
Immediate Anesthesia Transfer of Care Note  Patient: [name]  Procedure(s) Performed: Procedure(s): INCISION AND DRAINAGE PERI-RECTAL ABSCESS (N/A)  Patient Location: PACU  Anesthesia Type:General  Level of Consciousness: awake, alert  and patient cooperative  Airway & Oxygen Therapy: Patient Spontanous Breathing and Patient connected to face mask oxygen  Post-op Assessment: Report given to PACU RN, Post -op Vital signs reviewed and stable and Patient moving all extremities  Post vital signs: Reviewed and stable  Complications: No apparent anesthesia complications

## 2013-09-18 NOTE — Interval H&P Note (Signed)
History and Physical Interval Note:  09/18/2013 7:18 AM  [name]  has presented today for surgery, with the diagnosis of peri-rectal abscess  The various methods of treatment have been discussed with the patient and family. After consideration of risks, benefits and other options for treatment, the patient has consented to  Procedure(s): INCISION AND DRAINAGE PERI-RECTAL ABSCESS (N/A) as a surgical intervention .  The patient's history has been reviewed, patient examined, no change in status, stable for surgery.  I have reviewed the patient's chart and labs.  Questions were answered to the patient's satisfaction.     [name]

## 2013-09-22 ENCOUNTER — Encounter (HOSPITAL_COMMUNITY): Payer: Self-pay | Admitting: General Surgery

## 2014-06-07 ENCOUNTER — Ambulatory Visit (HOSPITAL_COMMUNITY)
Admission: RE | Admit: 2014-06-07 | Discharge: 2014-06-07 | Disposition: A | Discharge status: Home / Self Care | Payer: PRIVATE HEALTH INSURANCE | Source: Ambulatory Visit | Attending: Pulmonary Disease | Admitting: Pulmonary Disease

## 2014-06-07 ENCOUNTER — Other Ambulatory Visit (HOSPITAL_COMMUNITY): Payer: Self-pay | Admitting: Pulmonary Disease

## 2014-06-07 DIAGNOSIS — S40022A Contusion of left upper arm, initial encounter: Secondary | ICD-10-CM | POA: Insufficient documentation

## 2014-06-07 DIAGNOSIS — M79602 Pain in left arm: Secondary | ICD-10-CM | POA: Diagnosis present

## 2014-06-07 DIAGNOSIS — M25532 Pain in left wrist: Secondary | ICD-10-CM | POA: Diagnosis present

## 2014-06-07 DIAGNOSIS — W19XXXA Unspecified fall, initial encounter: Secondary | ICD-10-CM

## 2014-06-07 DIAGNOSIS — S60212A Contusion of left wrist, initial encounter: Secondary | ICD-10-CM | POA: Diagnosis not present

## 2014-06-07 DIAGNOSIS — W11XXXA Fall on and from ladder, initial encounter: Secondary | ICD-10-CM | POA: Insufficient documentation

## 2014-10-19 NOTE — H&P (Signed)
   Patient Name: JohnBlackwell Date of Birth: January 04, 1961  Vital Signs:  Vitals as of: 10/19/2014: Systolic 143: Diastolic 85: Heart Rate 75: Temp 36.67C: Height 175CM: Weight 90.72KG: BMI 29.53   BMI:   Subjective: This 54 year old male presents for followup.  Still having intermittent episodes of drainage from the perianal fisutla. Patient has    significant complaints.   Social History: Reviewed  Social History  Preferred Language: English ([place]) Race:  White Ethnicity: Not Hispanic / Latino Age: 58 Years 10 Months Marital Status:  M Alcohol:  No Recreational drug(s):  No       Meds:  nexium  Allergies: no drug allergies found.     Objective: General: Well appearing, well nourished in no distress. Lungs:  CTA Cor:  RRR without s3, s4, murmus Rectal:  Posterior anal fistula still present with some cloudy drainage.  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   Follow-up:Pending Surgery

## 2014-10-22 NOTE — Patient Instructions (Signed)
[name]  10/22/2014     @   Your procedure is scheduled on 10/29/2014.  Report to [facility] at 6:15 A.M.  Call this number if you have problems the morning of surgery:  [phone]   Remember:  Do not eat food or drink liquids after midnight.  Take these medicines the morning of surgery with A SIP OF WATER Zyrtec, Prilosec   Do not wear jewelry, make-up or nail polish.  Do not wear lotions, powders, or perfumes.  You may wear deodorant.  Do not shave 48 hours prior to surgery.  Men may shave face and neck.  Do not bring valuables to the hospital.  [facility] is not responsible for any belongings or valuables.  Contacts, dentures or bridgework may not be worn into surgery.  Leave your suitcase in the car.  After surgery it may be brought to your room.  For patients admitted to the hospital, discharge time will be determined by your treatment team.  Patients discharged the day of surgery will not be allowed to drive home.    Please read over the following fact sheets that you were given. Surgical Site Infection Prevention and Anesthesia Post-op Instructions     PATIENT INSTRUCTIONS POST-ANESTHESIA  IMMEDIATELY FOLLOWING SURGERY:  Do not drive or operate machinery for the first twenty four hours after surgery.  Do not make any important decisions for twenty four hours after surgery or while taking narcotic pain medications or sedatives.  If you develop intractable nausea and vomiting or a severe headache please notify your doctor immediately.  FOLLOW-UP:  Please make an appointment with your surgeon as instructed. You do not need to follow up with anesthesia unless specifically instructed to do so.  WOUND CARE INSTRUCTIONS (if applicable):  Keep a dry clean dressing on the anesthesia/puncture wound site if there is drainage.  Once the wound has quit draining you may leave it open to air.  Generally you should leave the bandage intact for twenty  four hours unless there is drainage.  If the epidural site drains for more than 36-48 hours please call the anesthesia department.  QUESTIONS?:  Please feel free to call your physician or the hospital operator if you have any questions, and they will be happy to assist you.      Anal Fissure, Adult An anal fissure is a small tear or crack in the skin around the anus. Bleeding from a fissure usually stops on its own within a few minutes. However, bleeding will often occur again with each bowel movement until the crack heals. CAUSES This condition may be caused by:  Passing large, hard stool (feces).  Frequent diarrhea.  Constipation.  Inflammatory bowel disease (Crohn disease or ulcerative colitis).  Infections.  Anal sex. SYMPTOMS Symptoms of this condition include:  Bleeding from the rectum.  Small amounts of blood seen on your stool, on toilet paper, or in the toilet after a bowel movement.  Painful bowel movements.  Itching or irritation around the anus. DIAGNOSIS A health care provider may diagnose this condition by closely examining the anal area. An anal fissure can usually be seen with careful inspection. In some cases, a rectal exam may be performed, or a short tube (anoscope) may be used to examine the anal canal. TREATMENT Treatment for this condition may include:  Taking steps to avoid constipation. This may include making changes to your diet, such as increasing your intake of fiber or fluid.  Taking fiber supplements. These  supplements can soften your stool to help make bowel movements easier. Your health care provider may also prescribe a stool softener if your stool is often hard.  Taking sitz baths. This may help to heal the tear.  Using medicated creams or ointments. These may be prescribed to lessen discomfort. HOME CARE INSTRUCTIONS Eating and Drinking  Avoid foods that may be constipating, such as bananas and dairy products.  Drink enough fluid to  keep your urine clear or pale yellow.  Maintain a diet that is high in fruits, whole grains, and vegetables. General Instructions  Keep the anal area as clean and dry as possible.  Take sitz baths as told by your health care provider. Do not use soap in the sitz baths.  Take over-the-counter and prescription medicines only as told by your health care provider.  Use creams or ointments only as told by your health care provider.  Keep all follow-up visits as told by your health care provider. This is important. SEEK MEDICAL CARE IF:  You have more bleeding.  You have a fever.  You have diarrhea that is mixed with blood.  You continue to have pain.  Your problem is getting worse rather than better.   This information is not intended to replace advice given to you by your health care provider. Make sure you discuss any questions you have with your health care provider.   Document Released: 01/01/2005 Document Revised: 09/22/2014 Document Reviewed: 03/29/2014 Elsevier Interactive Patient Education [employer]

## 2014-10-26 ENCOUNTER — Encounter (HOSPITAL_COMMUNITY)
Admission: RE | Admit: 2014-10-26 | Discharge: 2014-10-26 | Disposition: A | Discharge status: Home / Self Care | Payer: PRIVATE HEALTH INSURANCE | Source: Ambulatory Visit | Attending: General Surgery | Admitting: General Surgery

## 2014-10-26 ENCOUNTER — Other Ambulatory Visit: Payer: Self-pay

## 2014-10-26 ENCOUNTER — Encounter (HOSPITAL_COMMUNITY): Payer: Self-pay

## 2014-10-26 DIAGNOSIS — K603 Anal fistula: Secondary | ICD-10-CM | POA: Diagnosis not present

## 2014-10-26 DIAGNOSIS — Z01810 Encounter for preprocedural cardiovascular examination: Secondary | ICD-10-CM | POA: Diagnosis not present

## 2014-10-26 DIAGNOSIS — Z01812 Encounter for preprocedural laboratory examination: Secondary | ICD-10-CM | POA: Diagnosis not present

## 2014-10-26 HISTORY — DX: Other seasonal allergic rhinitis: J30.2

## 2014-10-26 HISTORY — DX: Dorsalgia, unspecified: M54.9

## 2014-10-26 HISTORY — DX: Unspecified osteoarthritis, unspecified site: M19.90

## 2014-10-26 HISTORY — DX: Other chronic pain: G89.29

## 2014-10-26 LAB — BASIC METABOLIC PANEL
Anion gap: 6 (ref 5–15)
BUN: 15 mg/dL (ref 6–20)
CHLORIDE: 106 mmol/L (ref 101–111)
CO2: 29 mmol/L (ref 22–32)
CREATININE: 0.9 mg/dL (ref 0.61–1.24)
Calcium: 9 mg/dL (ref 8.9–10.3)
GFR calc Af Amer: >60 mL/min (ref >60)
GFR calc non Af Amer: >60 mL/min (ref >60)
Glucose, Bld: 99 mg/dL (ref 65–99)
Potassium: 4.1 mmol/L (ref 3.5–5.1)
SODIUM: 141 mmol/L (ref 135–145)

## 2014-10-26 LAB — CBC WITH DIFFERENTIAL/PLATELET
BASOS PCT: 0 %
Basophils Absolute: 0 10*3/uL (ref 0.0–0.1)
EOS PCT: 2 %
Eosinophils Absolute: 0.1 10*3/uL (ref 0.0–0.7)
HCT: 43.9 % (ref 39.0–52.0)
Hemoglobin: 15 g/dL (ref 13.0–17.0)
LYMPHS ABS: 1.8 10*3/uL (ref 0.7–4.0)
Lymphocytes Relative: 34 %
MCH: 28.7 pg (ref 26.0–34.0)
MCHC: 34.2 g/dL (ref 30.0–36.0)
MCV: 83.9 fL (ref 78.0–100.0)
MONOS PCT: 7 %
Monocytes Absolute: 0.4 10*3/uL (ref 0.1–1.0)
Neutro Abs: 3.1 10*3/uL (ref 1.7–7.7)
Neutrophils Relative %: 57 %
PLATELETS: 179 10*3/uL (ref 150–400)
RBC: 5.23 MIL/uL (ref 4.22–5.81)
RDW: 12.5 % (ref 11.5–15.5)
WBC: 5.4 10*3/uL (ref 4.0–10.5)

## 2014-10-26 NOTE — Pre-Procedure Instructions (Signed)
Patient given information to sign up for my chart at home. 

## 2014-10-29 ENCOUNTER — Ambulatory Visit (HOSPITAL_COMMUNITY)
Admission: RE | Admit: 2014-10-29 | Discharge: 2014-10-29 | Disposition: A | Discharge status: Home / Self Care | Payer: PRIVATE HEALTH INSURANCE | Source: Ambulatory Visit | Attending: General Surgery | Admitting: General Surgery

## 2014-10-29 ENCOUNTER — Encounter (HOSPITAL_COMMUNITY)
Admission: RE | Disposition: A | Discharge status: Home / Self Care | Payer: Self-pay | Source: Ambulatory Visit | Attending: General Surgery

## 2014-10-29 ENCOUNTER — Encounter (HOSPITAL_COMMUNITY): Payer: Self-pay | Admitting: *Deleted

## 2014-10-29 ENCOUNTER — Ambulatory Visit (HOSPITAL_COMMUNITY): Payer: PRIVATE HEALTH INSURANCE | Admitting: Anesthesiology

## 2014-10-29 DIAGNOSIS — K603 Anal fistula: Secondary | ICD-10-CM | POA: Diagnosis not present

## 2014-10-29 DIAGNOSIS — Z01812 Encounter for preprocedural laboratory examination: Secondary | ICD-10-CM | POA: Insufficient documentation

## 2014-10-29 DIAGNOSIS — Z01810 Encounter for preprocedural cardiovascular examination: Secondary | ICD-10-CM | POA: Insufficient documentation

## 2014-10-29 HISTORY — PX: ANAL FISTULOTOMY: SHX6423

## 2014-10-29 SURGERY — ANAL FISTULOTOMY
Anesthesia: General

## 2014-10-29 MED ORDER — ONDANSETRON HCL 4 MG/2ML IJ SOLN
4.0000 mg | Freq: Once | INTRAMUSCULAR | Status: AC
  Administered 2014-10-29: 4 mg via INTRAVENOUS

## 2014-10-29 MED ORDER — FENTANYL CITRATE (PF) 100 MCG/2ML IJ SOLN
25.0000 ug | INTRAMUSCULAR | Status: AC
  Administered 2014-10-29 (×2): 25 ug via INTRAVENOUS

## 2014-10-29 MED ORDER — BUPIVACAINE-EPINEPHRINE (PF) 0.5% -1:200000 IJ SOLN
INTRAMUSCULAR | Status: AC
  Filled 2014-10-29: qty 30

## 2014-10-29 MED ORDER — LIDOCAINE HCL (CARDIAC) 20 MG/ML IV SOLN
INTRAVENOUS | Status: DC | PRN
  Administered 2014-10-29: 50 mg via INTRAVENOUS

## 2014-10-29 MED ORDER — LIDOCAINE VISCOUS 2 % MT SOLN
OROMUCOSAL | Status: AC
  Filled 2014-10-29: qty 15

## 2014-10-29 MED ORDER — ONDANSETRON HCL 4 MG/2ML IJ SOLN
INTRAMUSCULAR | Status: AC
Start: 2014-10-29 — End: 2014-10-29
  Filled 2014-10-29: qty 2

## 2014-10-29 MED ORDER — PROPOFOL 10 MG/ML IV BOLUS
INTRAVENOUS | Status: AC
  Filled 2014-10-29: qty 20

## 2014-10-29 MED ORDER — LIDOCAINE VISCOUS 2 % MT SOLN
OROMUCOSAL | Status: DC | PRN
  Administered 2014-10-29: 15 mL

## 2014-10-29 MED ORDER — LACTATED RINGERS IV SOLN
INTRAVENOUS | Status: DC
  Administered 2014-10-29: 1000 mL via INTRAVENOUS

## 2014-10-29 MED ORDER — SODIUM CHLORIDE 0.9 % IR SOLN
Status: DC | PRN
  Administered 2014-10-29: 1000 mL

## 2014-10-29 MED ORDER — SUCCINYLCHOLINE CHLORIDE 20 MG/ML IJ SOLN
INTRAMUSCULAR | Status: AC
  Filled 2014-10-29: qty 1

## 2014-10-29 MED ORDER — FENTANYL CITRATE (PF) 100 MCG/2ML IJ SOLN
INTRAMUSCULAR | Status: AC
Start: 2014-10-29 — End: 2014-10-29
  Filled 2014-10-29: qty 2

## 2014-10-29 MED ORDER — ROCURONIUM BROMIDE 50 MG/5ML IV SOLN
INTRAVENOUS | Status: AC
  Filled 2014-10-29: qty 1

## 2014-10-29 MED ORDER — ONDANSETRON HCL 4 MG/2ML IJ SOLN: 4.0000 mg | Freq: Once | INTRAMUSCULAR | Status: DC | PRN

## 2014-10-29 MED ORDER — GLYCOPYRROLATE 0.2 MG/ML IJ SOLN
INTRAMUSCULAR | Status: AC
  Filled 2014-10-29: qty 1

## 2014-10-29 MED ORDER — MIDAZOLAM HCL 2 MG/2ML IJ SOLN
INTRAMUSCULAR | Status: AC
  Filled 2014-10-29: qty 2

## 2014-10-29 MED ORDER — PROPOFOL 10 MG/ML IV BOLUS
INTRAVENOUS | Status: AC
Start: 2014-10-29 — End: 2014-10-29
  Filled 2014-10-29: qty 20

## 2014-10-29 MED ORDER — METRONIDAZOLE IN NACL 5-0.79 MG/ML-% IV SOLN
500.0000 mg | INTRAVENOUS | Status: AC
  Administered 2014-10-29: 500 mg via INTRAVENOUS

## 2014-10-29 MED ORDER — CIPROFLOXACIN IN D5W 400 MG/200ML IV SOLN
INTRAVENOUS | Status: AC
  Filled 2014-10-29: qty 200

## 2014-10-29 MED ORDER — HYDROCODONE-ACETAMINOPHEN 5-325 MG PO TABS: 1.0000 | ORAL_TABLET | ORAL | Status: DC | PRN

## 2014-10-29 MED ORDER — CIPROFLOXACIN IN D5W 400 MG/200ML IV SOLN
400.0000 mg | INTRAVENOUS | Status: AC
  Administered 2014-10-29: 400 mg via INTRAVENOUS

## 2014-10-29 MED ORDER — BUPIVACAINE HCL (PF) 0.5 % IJ SOLN
INTRAMUSCULAR | Status: AC
  Filled 2014-10-29: qty 30

## 2014-10-29 MED ORDER — GLYCOPYRROLATE 0.2 MG/ML IJ SOLN
0.2000 mg | Freq: Once | INTRAMUSCULAR | Status: AC
Start: 2014-10-29 — End: 2014-10-29
  Administered 2014-10-29: 0.2 mg via INTRAVENOUS

## 2014-10-29 MED ORDER — METRONIDAZOLE IN NACL 5-0.79 MG/ML-% IV SOLN
INTRAVENOUS | Status: AC
  Filled 2014-10-29: qty 100

## 2014-10-29 MED ORDER — FENTANYL CITRATE (PF) 100 MCG/2ML IJ SOLN
INTRAMUSCULAR | Status: AC
Start: 2014-10-29 — End: 2014-10-29
  Filled 2014-10-29: qty 4

## 2014-10-29 MED ORDER — LIDOCAINE HCL (PF) 1 % IJ SOLN
INTRAMUSCULAR | Status: AC
Start: 2014-10-29 — End: 2014-10-29
  Filled 2014-10-29: qty 5

## 2014-10-29 MED ORDER — PROPOFOL 10 MG/ML IV BOLUS
INTRAVENOUS | Status: DC | PRN
  Administered 2014-10-29: 200 mg via INTRAVENOUS

## 2014-10-29 MED ORDER — BUPIVACAINE HCL (PF) 0.5 % IJ SOLN
INTRAMUSCULAR | Status: DC | PRN
  Administered 2014-10-29: 10 mL

## 2014-10-29 MED ORDER — MIDAZOLAM HCL 2 MG/2ML IJ SOLN
1.0000 mg | INTRAMUSCULAR | Status: DC | PRN
  Administered 2014-10-29: 2 mg via INTRAVENOUS

## 2014-10-29 MED ORDER — FENTANYL CITRATE (PF) 100 MCG/2ML IJ SOLN
25.0000 ug | INTRAMUSCULAR | Status: DC | PRN
  Administered 2014-10-29: 50 ug via INTRAVENOUS
  Filled 2014-10-29: qty 2

## 2014-10-29 MED ORDER — KETOROLAC TROMETHAMINE 30 MG/ML IJ SOLN
30.0000 mg | Freq: Once | INTRAMUSCULAR | Status: AC
Start: 2014-10-29 — End: 2014-10-29
  Administered 2014-10-29: 30 mg via INTRAVENOUS
  Filled 2014-10-29: qty 1

## 2014-10-29 SURGICAL SUPPLY — 27 items
BAG HAMPER (MISCELLANEOUS) ×3 IMPLANT
CLOTH BEACON ORANGE TIMEOUT ST (SAFETY) ×3 IMPLANT
COVER LIGHT HANDLE STERIS (MISCELLANEOUS) ×6 IMPLANT
COVER MAYO STAND XLG (DRAPE) ×3 IMPLANT
DECANTER SPIKE VIAL GLASS SM (MISCELLANEOUS) ×3 IMPLANT
DRAPE PROXIMA HALF (DRAPES) ×3 IMPLANT
ELECT NEEDLE TIP 2.8 STRL (NEEDLE) ×3 IMPLANT
ELECT REM PT RETURN 9FT ADLT (ELECTROSURGICAL) ×3
ELECTRODE REM PT RTRN 9FT ADLT (ELECTROSURGICAL) ×1 IMPLANT
GAUZE SPONGE 4X4 12PLY STRL (GAUZE/BANDAGES/DRESSINGS) ×3 IMPLANT
GLOVE BIOGEL PI IND STRL 7.0 (GLOVE) ×2 IMPLANT
GLOVE BIOGEL PI INDICATOR 7.0 (GLOVE) ×4
GLOVE ECLIPSE 6.5 STRL STRAW (GLOVE) ×3 IMPLANT
GLOVE SURG SS PI 7.5 STRL IVOR (GLOVE) ×3 IMPLANT
GOWN STRL REUS W/TWL LRG LVL3 (GOWN DISPOSABLE) ×3 IMPLANT
GOWN STRL REUS W/TWL XL LVL3 (GOWN DISPOSABLE) ×3 IMPLANT
HEMOSTAT SURGICEL 4X8 (HEMOSTASIS) ×3 IMPLANT
KIT ROOM TURNOVER AP CYSTO (KITS) ×3 IMPLANT
MANIFOLD NEPTUNE II (INSTRUMENTS) ×3 IMPLANT
NEEDLE HYPO 25X1 1.5 SAFETY (NEEDLE) ×3 IMPLANT
NS IRRIG 1000ML POUR BTL (IV SOLUTION) ×3 IMPLANT
PACK PERI GYN (CUSTOM PROCEDURE TRAY) ×3 IMPLANT
PAD ARMBOARD 7.5X6 YLW CONV (MISCELLANEOUS) ×3 IMPLANT
SET BASIN LINEN APH (SET/KITS/TRAYS/PACK) ×3 IMPLANT
SURGILUBE 3G PEEL PACK STRL (MISCELLANEOUS) ×3 IMPLANT
SUT SILK 0 FSL (SUTURE) ×3 IMPLANT
SYR CONTROL 10ML LL (SYRINGE) ×3 IMPLANT

## 2014-10-29 NOTE — Interval H&P Note (Signed)
History and Physical Interval Note:  10/29/2014 7:26 AM  Mathews ArgyleJohn E Westrich  has presented today for surgery, with the diagnosis of anal fistula  The various methods of treatment have been discussed with the patient and family. After consideration of risks, benefits and other options for treatment, the patient has consented to  Procedure(s): ANAL FISTULOTOMY (N/A) as a surgical intervention .  The patient's history has been reviewed, patient examined, no change in status, stable for surgery.  I have reviewed the patient's chart and labs.  Questions were answered to the patient's satisfaction.     Franky MachoJENKINS,Laurissa Cowper A

## 2014-10-29 NOTE — Op Note (Signed)
Patient:  Timothy ArgyleJohn E Crane  DOB:  [birth year]  MRN:  [id number]   Preop Diagnosis:  Anal fistula  Postop Diagnosis:  Same  Procedure:  Anal fistulotomy  Surgeon:  Franky MachoMark Nandini Bogdanski, M.D.  Anes:  Gen.  Indications:  Patient is a 54 year old white male who presents with perianal fistula. The risks and benefits of the procedure including bleeding, infection, recurrence of the fistula, and the possibility of incontinence were fully explained to the patient, who gave informed consent.  Procedure note:  The patient was placed in the lithotomy position after general anesthesia was administered. The perineum was prepped and draped using the usual sterile technique with Betadine. Surgical site confirmation was performed.  A punctate opening was noted at the posterior area of the anal verge. A probe was used and a tunnel was noted to be tracking in a straight line through the external sphincter muscle towards the rectum. There was no opening in the rectum. A partial fistulotomy was then performed up to the muscle. The granulomatous tissue was debrided and cauterized. The wound was left open. Care was taken to avoid cutting the external sphincter muscle. No purulent fluid was present. 0.5% Sensorcaine was instilled the surrounding perineum. The rectum was packed with Surgicel and Viscous Xylocaine.  All tape and needle counts were correct at the end of the procedure. The patient was awakened and transferred to PACU in stable condition.  Complications:  None  EBL:  Minimal  Specimen:  None

## 2014-10-29 NOTE — Anesthesia Postprocedure Evaluation (Signed)
Anesthesia Post Note  Patient: Timothy ArgyleJohn E Crane  Procedure(s) Performed: Procedure(s) (LRB): ANAL FISTULOTOMY (N/A)  Anesthesia type: General  Patient location: PACU  Post pain: Pain level controlled  Post assessment: Post-op Vital signs reviewed, Patient's Cardiovascular Status Stable, Respiratory Function Stable, Patent Airway, No signs of Nausea or vomiting and Pain level controlled    Post vital signs: Reviewed and stable  Level of consciousness: awake and alert   Complications: No apparent anesthesia complications

## 2014-10-29 NOTE — Transfer of Care (Signed)
Immediate Anesthesia Transfer of Care Note  Patient: Timothy ArgyleJohn E Crane  Procedure(s) Performed: Procedure(s): ANAL FISTULOTOMY (N/A)  Patient Location: PACU  Anesthesia Type:General  Level of Consciousness: awake, alert  and oriented  Airway & Oxygen Therapy: Patient Spontanous Breathing and Patient connected to face mask oxygen  Post-op Assessment: Report given to RN and Post -op Vital signs reviewed and stable  Post vital signs: Reviewed and stable  Last Vitals:  Filed Vitals:   10/29/14 0730  BP: 148/96  Temp:   Resp: 18    Complications: No apparent anesthesia complications

## 2014-10-29 NOTE — Anesthesia Procedure Notes (Signed)
Procedure Name: LMA Insertion Date/Time: 10/29/2014 7:39 AM Performed by: Caren MacadamARTER, Bertel Venard W Pre-anesthesia Checklist: Patient identified, Emergency Drugs available, Suction available and Patient being monitored Patient Re-evaluated:Patient Re-evaluated prior to inductionOxygen Delivery Method: Circle System Utilized Preoxygenation: Pre-oxygenation with 100% oxygen Intubation Type: IV induction Ventilation: Mask ventilation without difficulty LMA: LMA inserted LMA Size: 5.0 Number of attempts: 1 Airway Equipment and Method: Bite block Placement Confirmation: positive ETCO2 and breath sounds checked- equal and bilateral Tube secured with: Tape Dental Injury: Teeth and Oropharynx as per pre-operative assessment

## 2014-10-29 NOTE — Discharge Instructions (Signed)
Anal Fistula °An anal fistula is an abnormal tunnel that develops between the bowel and the skin near the outside of the anus, where stool (feces) comes out. The anus has many tiny glands that make lubricating fluid. Sometimes, these glands become plugged and infected, and that can cause a fluid-filled pocket (abscess) to form. An anal fistula often develops after this infection or abscess. °CAUSES °In most cases, an anal fistula is caused by a past or current anal abscess. Other causes include: °· A complication of surgery. °· Trauma to the rectal area. °· Radiation to the area. °· Medical conditions or diseases, such as: °¨ Chronic inflammatory bowel disease, such as Crohn disease or ulcerative colitis. °¨ Colon cancer or rectal cancer. °¨ Diverticular disease, such as diverticulitis. °¨ An STD (sexually transmitted disease), such as gonorrhea, chlamydia, or syphilis. °¨ An infection that is caused by HIV (human immunodeficiency virus). °¨ Foreign body in the rectum. °SYMPTOMS °Symptoms of this condition include: °· Throbbing or constant pain that may be worse while you are sitting. °· Swelling or irritation around the anus. °· Drainage of pus or blood from an opening near the anus. °· Pain with bowel movements. °· Fever or chills. °DIAGNOSIS °Your health care provider will examine the area to find the openings of the anal fistula and the fistula tract. The external opening of the anal fistula may be seen during a physical exam. You may also have tests, including: °· An exam of the rectal area with a gloved hand (digital rectal exam). °· An exam with a probe or scope to help locate the internal opening of the fistula. °· Imaging tests to find the exact location and path of the fistula. These tests may include X-rays, an ultrasound, a CT scan, or MRI. The path is made visible by a dye that is injected into the fistula opening. °You may have other tests to find the cause of the anal fistula. °TREATMENT °The most  common treatment for an anal fistula is surgery. The type of surgery that is used will depend on where the fistula is located and how complex the fistula is. Surgical options include: °· A fistulotomy. The whole fistula is opened up, and the contents are drained to promote healing. °· Seton placement. A silk string (seton) is placed into the fistula during a fistulotomy. This helps to drain any infection to promote healing. °· Advancement flap procedure. Tissue is removed from your rectum or the skin around the anus and is attached to the opening of the fistula. °· Bioprosthetic plug. A cone-shaped plug is made from your tissue and is used to block the opening of the fistula. °Some anal fistulas do not require surgery. A nonsurgical treatment option involves injecting a fibrin glue to seal the fistula. You also may be prescribed an antibiotic medicine to treat an infection. °HOME CARE INSTRUCTIONS °Medicines °· Take over-the-counter and prescription medicines only as told by your health care provider. °· If you were prescribed an antibiotic medicine, take it as told by your health care provider. Do not stop taking the antibiotic even if you start to feel better. °· Use a stool softener or a laxative if told to do so by your health care provider. °General Instructions °· Eat a high-fiber diet as told by your health care provider. This can help to prevent constipation. °· Drink enough fluid to keep your urine clear or pale yellow. °· Take a warm sitz bath for 15-20 minutes, 3-4 times per day, or as   told by your health care provider. Sitz baths can ease your pain and discomfort and help with healing. °· Follow good hygiene to keep the anal area as clean and dry as possible. Use wet toilet paper or a moist towelette after each bowel movement. °· Keep all follow-up visits as told by your health care provider. This is important. °SEEK MEDICAL CARE IF: °· You have increased pain that is not controlled with  medicines. °· You have new redness or swelling around the anal area. °· You have new fluid, blood, or pus coming from the anal area. °· You have tenderness or warmth around the anal area. °SEEK IMMEDIATE MEDICAL CARE IF: °· You have a fever. °· You have severe pain. °· You have chills or diarrhea. °· You have severe problems urinating or having a bowel movement. °  °This information is not intended to replace advice given to you by your health care provider. Make sure you discuss any questions you have with your health care provider. °  °Document Released: 12/15/2007 Document Revised: 09/22/2014 Document Reviewed: 03/29/2014 °Elsevier Interactive Patient Education ©2016 [employer] ° °

## 2014-10-29 NOTE — Anesthesia Preprocedure Evaluation (Signed)
Anesthesia Evaluation  Patient identified by MRN, date of birth, ID band Patient awake    Reviewed: Allergy & Precautions, H&P , NPO status , Patient's Chart, lab work & pertinent test results  History of Anesthesia Complications (+) PONV and history of anesthetic complications  Airway Mallampati: I  TM Distance: >3 FB     Dental  (+) Teeth Intact   Pulmonary neg pulmonary ROS,    breath sounds clear to auscultation       Cardiovascular negative cardio ROS   Rhythm:Regular Rate:Normal     Neuro/Psych    GI/Hepatic neg GERD (not active)  ,  Endo/Other    Renal/GU      Musculoskeletal   Abdominal   Peds  Hematology   Anesthesia Other Findings   Reproductive/Obstetrics                             Anesthesia Physical Anesthesia Plan  ASA: I  Anesthesia Plan: General   Post-op Pain Management:    Induction: Intravenous  Airway Management Planned: LMA  Additional Equipment:   Intra-op Plan:   Post-operative Plan: Extubation in OR  Informed Consent: I have reviewed the patients History and Physical, chart, labs and discussed the procedure including the risks, benefits and alternatives for the proposed anesthesia with the patient or authorized representative who has indicated his/her understanding and acceptance.     Plan Discussed with:   Anesthesia Plan Comments:         Anesthesia Quick Evaluation

## 2014-11-01 ENCOUNTER — Encounter (HOSPITAL_COMMUNITY): Payer: Self-pay | Admitting: General Surgery

## 2015-02-23 ENCOUNTER — Ambulatory Visit (HOSPITAL_COMMUNITY): Payer: PRIVATE HEALTH INSURANCE | Attending: Physical Medicine and Rehabilitation | Admitting: Physical Therapy

## 2015-02-23 DIAGNOSIS — M545 Low back pain, unspecified: Secondary | ICD-10-CM

## 2015-02-23 DIAGNOSIS — M2560 Stiffness of unspecified joint, not elsewhere classified: Secondary | ICD-10-CM | POA: Insufficient documentation

## 2015-02-23 NOTE — Therapy (Signed)
[facility] [address] [place], [place], [address] Phone: [phone]   Fax:  [phone]  Physical Therapy Evaluation  Patient Details  Name: [name] MRN: [id number] Date of Birth: [birth year] Referring Provider: Romero Belling   Encounter Date: 02/23/2015      PT End of Session - 02/23/15 1428    Visit Number 1   Number of Visits 4   Date for PT Re-Evaluation 03/25/15   Authorization Type medcost    Authorization - Visit Number 1   Authorization - Number of Visits 4   PT Start Time 1345   PT Stop Time 1430   PT Time Calculation (min) 45 min   Activity Tolerance Patient tolerated treatment well      Past Medical History  Diagnosis Date  . PONV (postoperative nausea and vomiting)   . GERD (gastroesophageal reflux disease)   . Seasonal allergies   . Degenerative joint disease   . Chronic back pain     Past Surgical History  Procedure Laterality Date  . Colonoscopy N/A 01/06/2013    Procedure: COLONOSCOPY;  Surgeon: Dalia Heading, MD;  [facility];  Service: Gastroenterology;  Laterality: N/A;  . Shoulder arthroscopy Bilateral   . Wrist arthroscopy Right     torn ligament  . Knee arthroscopy Bilateral   . Plantar fascia surgery Right   . Repair tendons foot    . Incision and drainage abscess N/A 09/18/2013    Procedure: INCISION AND DRAINAGE PERI-RECTAL ABSCESS;  Surgeon: Dalia Heading, MD;  Location: [facility];  Service: General;  Laterality: N/A;  . Anal fistulotomy N/A 10/29/2014    Procedure: ANAL FISTULOTOMY;  Surgeon: Franky Macho, MD;  Location: [facility];  Service: General;  Laterality: N/A;    There were no vitals filed for this visit.  Visit Diagnosis:  Left-sided low back pain without sciatica  Stiffness of joints, multiple sites      Subjective Assessment - 02/23/15 1352    Subjective [name] states that he has degenerative disc disease at L4-5. He states that there is a bone spur  that is hitting his nerve.   He has had four injections in his back and the next step is a "nerve burn" but before he can do this he needs to go through therapy.  The patient has had pain off and on for at least eight years but recently his pain has increased  significantly.  He denies radicular pain.  He has the most pain when he has to bend over and with transitional motions such as sit to stand.  He is unable to ascend from a squatted position without the use of his arms.  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 He states his pain tends to be at a 6 but with transitional motions will go as high as a 9 or 10,(strong enough to take his breath away).    How long can you sit comfortably? almost immediately    How long can you stand comfortably? no problem    How long can you walk comfortably? no problem    Patient Stated Goals pt does not feel therapy will help he is just going thru therapy to get the nerve burn.    Currently in Pain? Yes   Pain Score 6    Pain Location Back   Pain Orientation Left;Lower   Pain Descriptors / Indicators Aching  Pain Type Chronic pain   Pain Onset More than a month ago   Pain Frequency Constant   Aggravating Factors  sitting; bending forward    Pain Relieving Factors walking             [facility] PT Assessment - 02/23/15 0001    Assessment   Medical Diagnosis low back pain    Referring Provider Romero Belling    Onset Date/Surgical Date 02/08/15   Hand Dominance Right   Next MD Visit 03/10/2015   Prior Therapy yes years ago   Precautions   Precautions None   Restrictions   Weight Bearing Restrictions No   Balance Screen   Has the patient fallen in the past 6 months No   Has the patient had a decrease in activity level because of a fear of falling?  No   Is the patient reluctant to leave their home because of a fear of falling?  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    Leisure none   Cognition   Overall Cognitive Status Within Functional Limits for tasks assessed   Observation/Other Assessments   Focus on Therapeutic Outcomes (FOTO)  61   ROM / Strength   AROM / PROM / Strength AROM;Strength   AROM   AROM Assessment Site Lumbar   Lumbar Extension 30  reps improves    Lumbar - Right Side Bend 27  no change    Lumbar - Left Side Bend 22  aggrevates   Strength   Strength Assessment Site Hip;Knee   Right/Left Hip Right;Left   Right Hip Flexion 5/5   Right Hip Extension 5/5   Right Hip ABduction 5/5   Left Hip Flexion 5/5   Left Hip ABduction 5/5   Right/Left Knee Right;Left   Right Knee Flexion 5/5   Right Knee Extension 5/5   Left Knee Flexion 5/5   Left Knee Extension 5/5   Flexibility   Soft Tissue Assessment /Muscle Length yes   Hamstrings Lt Hamstring 145; RT                    [facility] Adult PT Treatment/Exercise - 02/23/15 0001    Exercises   Exercises Lumbar   Lumbar Exercises: Stretches   Active Hamstring Stretch 3 reps;30 seconds   Standing Extension 5 reps   Prone on Elbows Stretch 5 reps   Lumbar Exercises: Supine   Ab Set 10 reps   Bent Knee Raise 10 reps   Bridge 10 reps                PT Education - 02/23/15 1427    Education provided Yes   Education Details HEP    Person(s) Educated Patient   Methods Explanation;Handout   Comprehension Verbalized understanding;Returned demonstration          PT Short Term Goals - 02/23/15 1438    PT SHORT TERM GOAL #1   Title Pt to improve core strength to be able to come sit to stand witth pain no greater than a 5/10.    Time 2   Period Weeks   Status New   PT SHORT TERM GOAL #2   Title Pt to be able arise from a squatted postion with pain no greater than a 5/10   Time 2  Period Weeks   Status New   PT SHORT  TERM GOAL #3   Title Pt to be able to sit for 15 minutes without increased pain to be able to enjoy eating  a quick meal    Time 2   Period Weeks           PT Long Term Goals - 02/23/15 1442    PT LONG TERM GOAL #1   Title Pt to be able to come sit to stand without his pain going above a 2/10    Time 4   Period Weeks   Status New   PT LONG TERM GOAL #2   Title Pt to be able to arise from a squatted position without having to use his UE    Time 4   Period Weeks   Status New   PT LONG TERM GOAL #3   Title Pt to be able to ride in a car for 40 minutes without increased pain to be able to travel.    Time 4   Period Weeks   Status New   PT LONG TERM GOAL #4   Title PT to be able to maintain a forward bent position in order to change a battery in a car with pain no greater than a 4/10 to perform work activites    Time 4   Period Weeks   Status New               Plan - 02/23/15 1429    Clinical Impression Statement [name] is a 55 yo male who has two full time jobs.  He has had low back pain for over eight years.  He has had four injections in his back but he is still experiencing pain.  His MD recommended ,"burning" of the nerve, however, his insurance would not cover this until he has had physical therapy.  He states he did have physical therapy approximately seven years ago and he continues to complete the exercises that he was given.  Examination demonstrates decreased abdominal strength, increased pain, diffculty with transitional motions.  and decreased ROM.  [name] will benefit from skilled PT to address these areas and maximize his functional abilty    Pt will benefit from skilled therapeutic intervention in order to improve on the following deficits Decreased range of motion;Decreased strength;Impaired flexibility;Pain   Rehab Potential Good   PT Frequency 1x / week  pt request due to having two full time jobs.   PT Duration 4 weeks   PT  Treatment/Interventions ADLs/Self Care Home Management;Therapeutic activities;Therapeutic exercise;Patient/family education   PT Next Visit Plan begin prone stabilization incluiding single arm raise, single leg raise, and opposite arm/leg raise.  Progress to sitting including sit to stand with stabilization, knee extension with stabilization , hip flexion with stabiliztion,   PT Home Exercise Plan given    Consulted and Agree with Plan of Care Patient         Problem List There are no active problems to display for this patient.   Virgina Organ, PT CLT [phone] 02/23/2015, 4:08 PM  [facility] [address] [place], [place], [address] Phone: [phone]   Fax:  [phone]  Name: [name] MRN: [id number] Date of Birth: 1960-11-28

## 2015-02-23 NOTE — Patient Instructions (Signed)
Backward Bend (Standing)    Arch backward to make hollow of back deeper. Hold __2__ seconds. Repeat __10__ times per set. Do ___1_ sets per session. Do ____2 sessions per day.  http://orth.exer.us/178   Copyright  VHI. All rights reserved.  On Elbows (Prone)    Rise up on elbows as high as possible, keeping hips on floor. Hold _60___ seconds. Repeat __1__ times per set. Do 1____ sets per session. Do __2__ sessions per day.  http://orth.exer.us/92   Copyright  VHI. All rights reserved.  Press-Up    Press upper body upward, keeping hips in contact with floor. Keep lower back and buttocks relaxed. Hold __5__ seconds. Repeat __10__ times per set. Do _1___ sets per session. Do 2____ sessions per day.  http://orth.exer.us/94   Copyright  VHI. All rights reserved.  Isometric Abdominal    Lying on back with knees bent, tighten stomach by pressing elbows down. Hold ___5_ seconds. Repeat __10__ times per set. Do ____1 sets per session. Do _2___ sessions per day.  http://orth.exer.us/1086   Copyright  VHI. All rights reserved.  Bridging    Slowly raise buttocks from floor, keeping stomach tight. Repeat _10___ times per set. Do __1__ sets per session. Do ___2_ sessions per day.  http://orth.exer.us/1096   Copyright  VHI. All rights reserved.  Bent Leg Lift (Hook-Lying)    Tighten stomach and slowly raise right leg ___2_ inches from floor. Keep trunk rigid. Hold __2__ seconds. Repeat _10___ times per set. Do _1___ sets per session. Do __2__ sessions per day.  http://orth.exer.us/1090   Copyright  VHI. All rights reserved.

## 2015-03-04 ENCOUNTER — Ambulatory Visit (HOSPITAL_COMMUNITY): Payer: PRIVATE HEALTH INSURANCE | Admitting: Physical Therapy

## 2015-03-04 DIAGNOSIS — M2560 Stiffness of unspecified joint, not elsewhere classified: Secondary | ICD-10-CM

## 2015-03-04 DIAGNOSIS — M545 Low back pain, unspecified: Secondary | ICD-10-CM

## 2015-03-04 NOTE — Therapy (Signed)
[facility] [address] [place], [place], [address] Phone: [phone]   Fax:  [phone]  Physical Therapy Treatment  Patient Details  Name: [name] MRN: [id number] Date of Birth: 21-Jul-1960 Referring Provider: Romero Belling   Encounter Date: 03/04/2015      PT End of Session - 03/04/15 1143    Visit Number 2   Number of Visits 4   Date for PT Re-Evaluation 03/25/15   Authorization Type medcost    Authorization - Visit Number 2   Authorization - Number of Visits 4   PT Start Time 1140   PT Stop Time 1228   PT Time Calculation (min) 48 min   Activity Tolerance Patient tolerated treatment well   Behavior During Therapy [facility] for tasks assessed/performed      Past Medical History  Diagnosis Date  . PONV (postoperative nausea and vomiting)   . GERD (gastroesophageal reflux disease)   . Seasonal allergies   . Degenerative joint disease   . Chronic back pain     Past Surgical History  Procedure Laterality Date  . Colonoscopy N/A 01/06/2013    Procedure: COLONOSCOPY;  Surgeon: Dalia Heading, MD;  [facility];  Service: Gastroenterology;  Laterality: N/A;  . Shoulder arthroscopy Bilateral   . Wrist arthroscopy Right     torn ligament  . Knee arthroscopy Bilateral   . Plantar fascia surgery Right   . Repair tendons foot    . Incision and drainage abscess N/A 09/18/2013    Procedure: INCISION AND DRAINAGE PERI-RECTAL ABSCESS;  Surgeon: Dalia Heading, MD;  Location: [facility];  Service: General;  Laterality: N/A;  . Anal fistulotomy N/A 10/29/2014    Procedure: ANAL FISTULOTOMY;  Surgeon: Franky Macho, MD;  Location: [facility];  Service: General;  Laterality: N/A;    There were no vitals filed for this visit.  Visit Diagnosis:  Left-sided low back pain without sciatica  Stiffness of joints, multiple sites      Subjective Assessment - 03/04/15 1141    Subjective Pt stated current left sided lower back pain  scale 4/10, increased pain bending and stooping relief with standing   Patient Stated Goals pt does not feel therapy will help he is just going thru therapy to get the nerve burn.    Currently in Pain? Yes   Pain Score 4    Pain Location Back   Pain Orientation Lower   Pain Descriptors / Indicators Aching   Pain Onset More than a month ago   Pain Frequency Constant   Aggravating Factors  sitting; bending forward   Pain Relieving Factors standing, walking   Effect of Pain on Daily Activities No limitiations just pain          [facility] Adult PT Treatment/Exercise - 03/04/15 0001    Lumbar Exercises: Stretches   Active Hamstring Stretch 3 reps;30 seconds   Active Hamstring Stretch Limitations supine with rope   Prone on Elbows Stretch Limitations 2 minutes   Press Ups 5 reps;10 seconds   Lumbar Exercises: Seated   Long Arc Quad on Chair Both;15 reps   LAQ on Chair Limitations with core activation   Hip Flexion on [place] 10 reps;Both   Hip Flexion on Ball Limitations on mat with core activatin   Lumbar Exercises: Supine   Bridge 15 reps   Lumbar Exercises: Prone   Single Arm Raise Right;Left;10 reps;3 seconds   Straight Leg Raise 10 reps;3 seconds   Opposite  Arm/Leg Raise Right arm/Left leg;Left arm/Right leg;10 reps;3 seconds            PT Short Term Goals - 02/23/15 1438    PT SHORT TERM GOAL #1   Title Pt to improve core strength to be able to come sit to stand witth pain no greater than a 5/10.    Time 2   Period Weeks   Status New   PT SHORT TERM GOAL #2   Title Pt to be able arise from a squatted postion with pain no greater than a 5/10   Time 2   Period Weeks   Status New   PT SHORT TERM GOAL #3   Title Pt to be able to sit for 15 minutes without increased pain to be able to enjoy eating  a quick meal    Time 2   Period Weeks           PT Long Term Goals - 02/23/15 1442    PT LONG TERM GOAL #1   Title Pt to be able to come sit to stand without his pain  going above a 2/10    Time 4   Period Weeks   Status New   PT LONG TERM GOAL #2   Title Pt to be able to arise from a squatted position without having to use his UE    Time 4   Period Weeks   Status New   PT LONG TERM GOAL #3   Title Pt to be able to ride in a car for 40 minutes without increased pain to be able to travel.    Time 4   Period Weeks   Status New   PT LONG TERM GOAL #4   Title PT to be able to maintain a forward bent position in order to change a battery in a car with pain no greater than a 4/10 to perform work activites    Time 4   Period Weeks   Status New               Plan - 03/04/15 1230    Clinical Impression Statement Reviewed goals, compliance and technique with HEP and copy of eval given to pt.  Session focus on improving core and lumbar stabiltiy.  Pt able to demonstrate appropriate form with all exercises with minimal cueing to improve core activation with movement and to keep pelvis neutral..  No reports of increased pain through session.  Pt given advance HEP including prone and sitting exercises.   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 [address] LPTA; CBIS [id number]   Juel Burrow 03/04/2015, 12:43 PM  [facility] [address] [place], [place], [address] Phone: [phone]   Fax:  [phone]  Name:  [name] MRN: [id number] Date of Birth: 25-Sep-1960

## 2015-03-04 NOTE — Patient Instructions (Signed)
Straight Leg Raise (Prone)    Abdomen and head supported, keep left knee locked and raise leg at hip. Avoid arching low back. Repeat 10-20 times per set. Do 1-2 sets per session. http://orth.exer.us/1112   Copyright  VHI. All rights reserved.  Opposite Arm / Leg Lift (Prone)    Abdomen and head supported, left knee locked, raise leg and opposite arm ____ inches from floor. Repeat 10-20 times per set. Do 1-2 sets per session.   http://orth.exer.us/1114   Copyright  VHI. All rights reserved.  Forward Lean (Sitting)    With straight back, tighten stomach and lean forward at hips. Repeat 10-20 times per set. Do 1-2 sets per session.  http://orth.exer.us/1142   Copyright  VHI. All rights reserved.    Sitting to Standing    With straight back, tighten stomach, place right leg back under chair, lean slightly forward and stand. Repeat 10-20 times per set. Do 1-2 sets per session.   http://orth.exer.us/1140   Copyright  VHI. All rights reserved.  Knee Extension (Sitting)    Place ____ pound weight on left ankle and straighten knee fully, lower slowly. Repeat 10-20 times per set. Do 1-2 sets per session.   http://orth.exer.us/732   Copyright  VHI. All rights reserved.   High Stepping in Place (Sitting)    Sitting on edge of chair with abdominal musculature togjt, alternately lift knees as high as possible. Keep torso erect with no increased curvature on lumbar region on spine Repeat 10-20 times, each leg.  Copyright  VHI. All rights reserved.

## 2015-03-07 ENCOUNTER — Ambulatory Visit (HOSPITAL_COMMUNITY): Payer: PRIVATE HEALTH INSURANCE

## 2015-03-07 DIAGNOSIS — M2560 Stiffness of unspecified joint, not elsewhere classified: Secondary | ICD-10-CM

## 2015-03-07 DIAGNOSIS — M545 Low back pain, unspecified: Secondary | ICD-10-CM

## 2015-03-07 NOTE — Therapy (Addendum)
[place], [place], [address] Phone: [phone]   Fax:  [phone]  Physical Therapy Treatment  Patient Details  Name: [name] MRN: [id number] Date of Birth: [birth year] Referring Provider: Laroy Apple   Encounter Date: 03/07/2015      PT End of Session - 03/07/15 1156    Visit Number 3   Number of Visits 4   Date for PT Re-Evaluation 03/25/15   Authorization Type medcost    Authorization - Visit Number 3   Authorization - Number of Visits 4   PT Start Time 6010   PT Stop Time 1226   PT Time Calculation (min) 38 min   Activity Tolerance Patient tolerated treatment well   Behavior During Therapy [facility] for tasks assessed/performed      Past Medical History  Diagnosis Date  . PONV (postoperative nausea and vomiting)   . GERD (gastroesophageal reflux disease)   . Seasonal allergies   . Degenerative joint disease   . Chronic back pain     Past Surgical History  Procedure Laterality Date  . Colonoscopy N/A 01/06/2013    Procedure: COLONOSCOPY;  Surgeon: Jamesetta So, MD;  [facility];  Service: Gastroenterology;  Laterality: N/A;  . Shoulder arthroscopy Bilateral   . Wrist arthroscopy Right     torn ligament  . Knee arthroscopy Bilateral   . Plantar fascia surgery Right   . Repair tendons foot    . Incision and drainage abscess N/A 09/18/2013    Procedure: INCISION AND DRAINAGE PERI-RECTAL ABSCESS;  Surgeon: Jamesetta So, MD;  Location: [facility];  Service: General;  Laterality: N/A;  . Anal fistulotomy N/A 10/29/2014    Procedure: ANAL FISTULOTOMY;  Surgeon: Aviva Signs, MD;  Location: [facility];  Service: General;  Laterality: N/A;    There were no vitals filed for this visit.  Visit Diagnosis:  Left-sided low back pain without sciatica  Stiffness of joints, multiple sites      Subjective Assessment - 03/07/15 1154    Subjective Pt stated his legs were sore following last  session, current pain scale Lt side lower back pain scale 3/10.  Pt. reports he had busy weekend at work.   Patient Stated Goals pt does not feel therapy will help he is just going thru therapy to get the nerve burn.    Currently in Pain? Yes   Pain Score 3    Pain Location Back   Pain Orientation Lower;Left   Pain Descriptors / Indicators Aching           [facility] Adult PT Treatment/Exercise - 03/07/15 0001    Lumbar Exercises: Stretches   Active Hamstring Stretch 3 reps;30 seconds   Active Hamstring Stretch Limitations supine with rope   Lumbar Exercises: Standing   Lifting 10 reps;Limitations   Lifting Weights (lbs) --  yellow tball   Lifting Limitations forward flexed (golf swing position) for work based activities from 23-28in mat height   Forward Lunge 15 reps   Forward Lunge Limitations 6in step   Lumbar Exercises: Quadruped   Single Arm Raise Right;Left;10 reps   Straight Leg Raise 10 reps;3 seconds   Opposite Arm/Leg Raise Right arm/Left leg;Left arm/Right leg;10 reps;3 seconds             PT Short Term Goals - 02/23/15 1438    PT SHORT TERM GOAL #1   Title Pt to improve core strength to be able to come sit to  stand witth pain no greater than a 5/10.    Time 2   Period Weeks   Status New   PT SHORT TERM GOAL #2   Title Pt to be able arise from a squatted postion with pain no greater than a 5/10   Time 2   Period Weeks   Status New   PT SHORT TERM GOAL #3   Title Pt to be able to sit for 15 minutes without increased pain to be able to enjoy eating  a quick meal    Time 2   Period Weeks           PT Long Term Goals - 02/23/15 1442    PT LONG TERM GOAL #1   Title Pt to be able to come sit to stand without his pain going above a 2/10    Time 4   Period Weeks   Status New   PT LONG TERM GOAL #2   Title Pt to be able to arise from a squatted position without having to use his UE    Time 4   Period Weeks   Status New   PT LONG TERM GOAL #3   Title Pt  to be able to ride in a car for 40 minutes without increased pain to be able to travel.    Time 4   Period Weeks   Status New   PT LONG TERM GOAL #4   Title PT to be able to maintain a forward bent position in order to change a battery in a car with pain no greater than a 4/10 to perform work activites    Time [place] - 03/07/15 1254    Clinical Impression Statement Session focus on improving core and proximal musculature and improving body mechanics with work based activities.  Progressed to quadruped position lumbar stability with min cueing for core activation for stabilizaiton, began lunges and reviewed form with functional squats.  Discussion held with positions for battery changing, trial wtih forward flexed squats through pt stated he has to standing with knee extened bracing against vehicle and flex with trunk to reach batteries in vehicles.  Pt will continue to benefits from core strengthening with forward lean positions for pain relief with work based activities.     PT Next Visit Plan Reassess next session.  Next session begin forward leans in tall kneeling position for core strengthening with work based activities, continue education on proper lifting techniques.        Problem List There are no active problems to display for this patient.  [address] LPTA; [place]  Aldona Lento 03/07/2015, 1:18 PM  [place] [address] [place], [place], [address] Phone: [phone]   Fax:  [phone]  Name: [name] MRN: [id number] Date of Birth: 05/21/1960  PHYSICAL THERAPY DISCHARGE SUMMARY  Visits from Start of Care: *3  Current functional level related to goals / functional outcomes: Unknown pt did not return   Remaining deficits: same   Education / Equipment: HEP  Plan: Patient agrees to discharge.  Patient goals were not met. Patient is  being discharged due to not returning since the last visit.  ?????      Rayetta Humphrey, [place] CLT [phone]

## 2015-03-07 NOTE — Patient Instructions (Addendum)
Arm / Leg Extension: Alternate (All-Fours)    Raise right arm and opposite leg. Do not arch neck. Repeat 10-20 times per set. Do 1-2 sets per session.  http://orth.exer.us/110   Copyright  VHI. All rights reserved.  Forward Lean (Sitting)    With straight back, tighten stomach and lean forward at hips. Repeat ____ times per set. Do ____ sets per session. Do ____ sessions per day.  http://orth.exer.us/1142   Copyright  VHI. All rights reserved.

## 2015-03-16 ENCOUNTER — Encounter (HOSPITAL_COMMUNITY): Payer: PRIVATE HEALTH INSURANCE

## 2015-03-22 ENCOUNTER — Encounter (HOSPITAL_COMMUNITY): Payer: PRIVATE HEALTH INSURANCE | Admitting: Physical Therapy

## 2015-04-19 DIAGNOSIS — K603 Anal fistula: Secondary | ICD-10-CM | POA: Insufficient documentation

## 2015-07-27 IMAGING — DX DG WRIST COMPLETE 3+V*L*
4 series · 4 of 4 positions shown · non-contrast
Comparison: None.

CLINICAL DATA: Fall from 12 foot ladder yesterday with wrist pain,
initial encounter

EXAM:
LEFT WRIST - COMPLETE 3+ VIEW

[wrist pa]
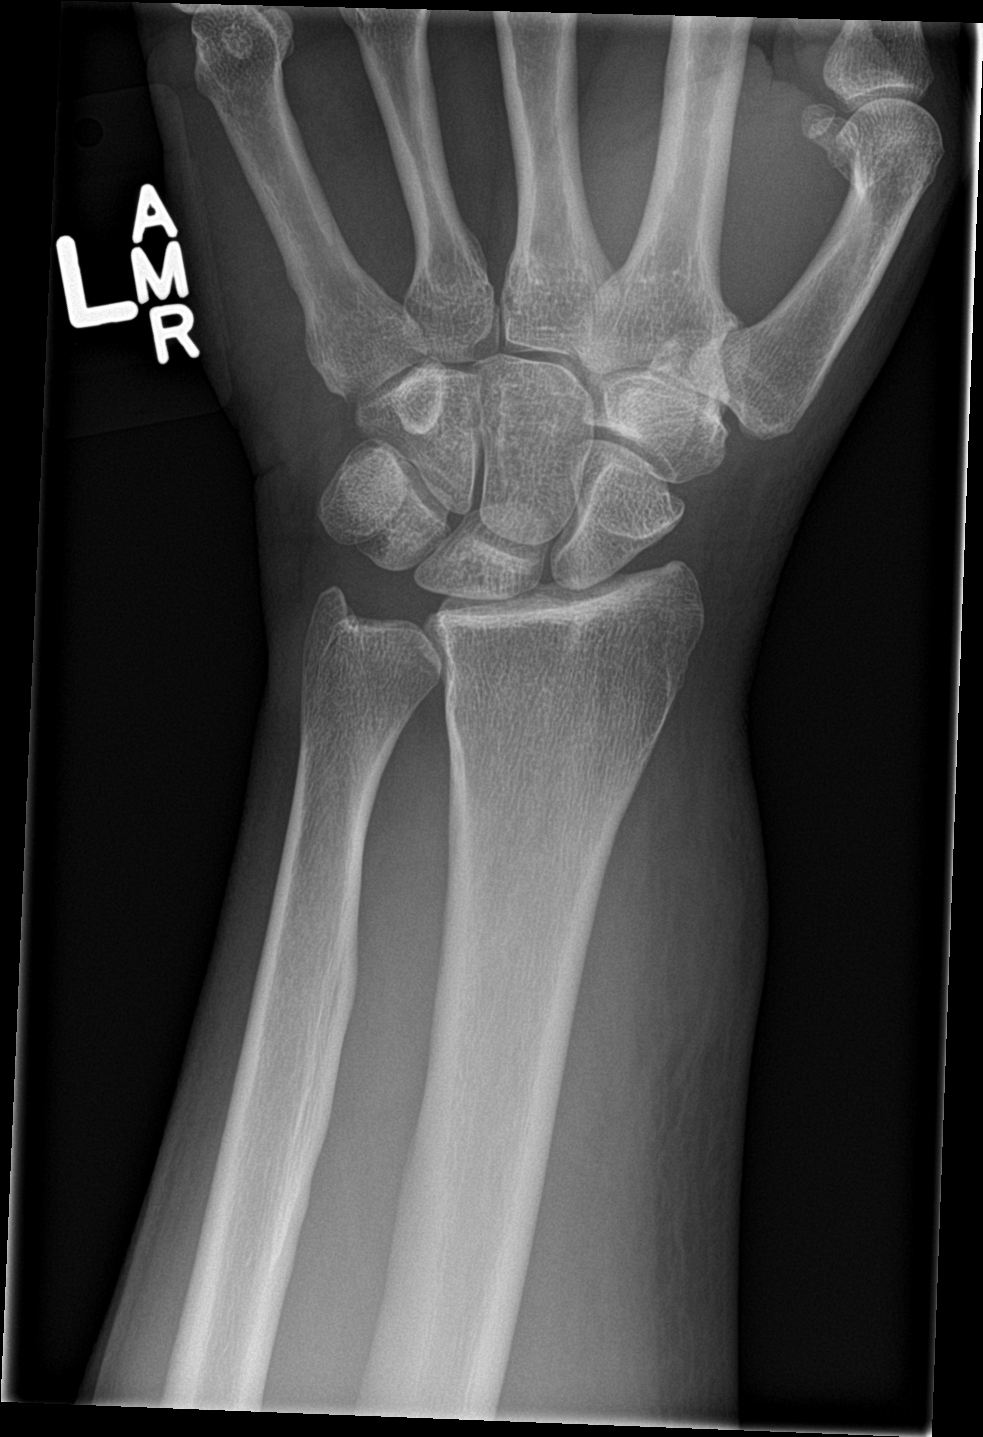

[wrist obl]
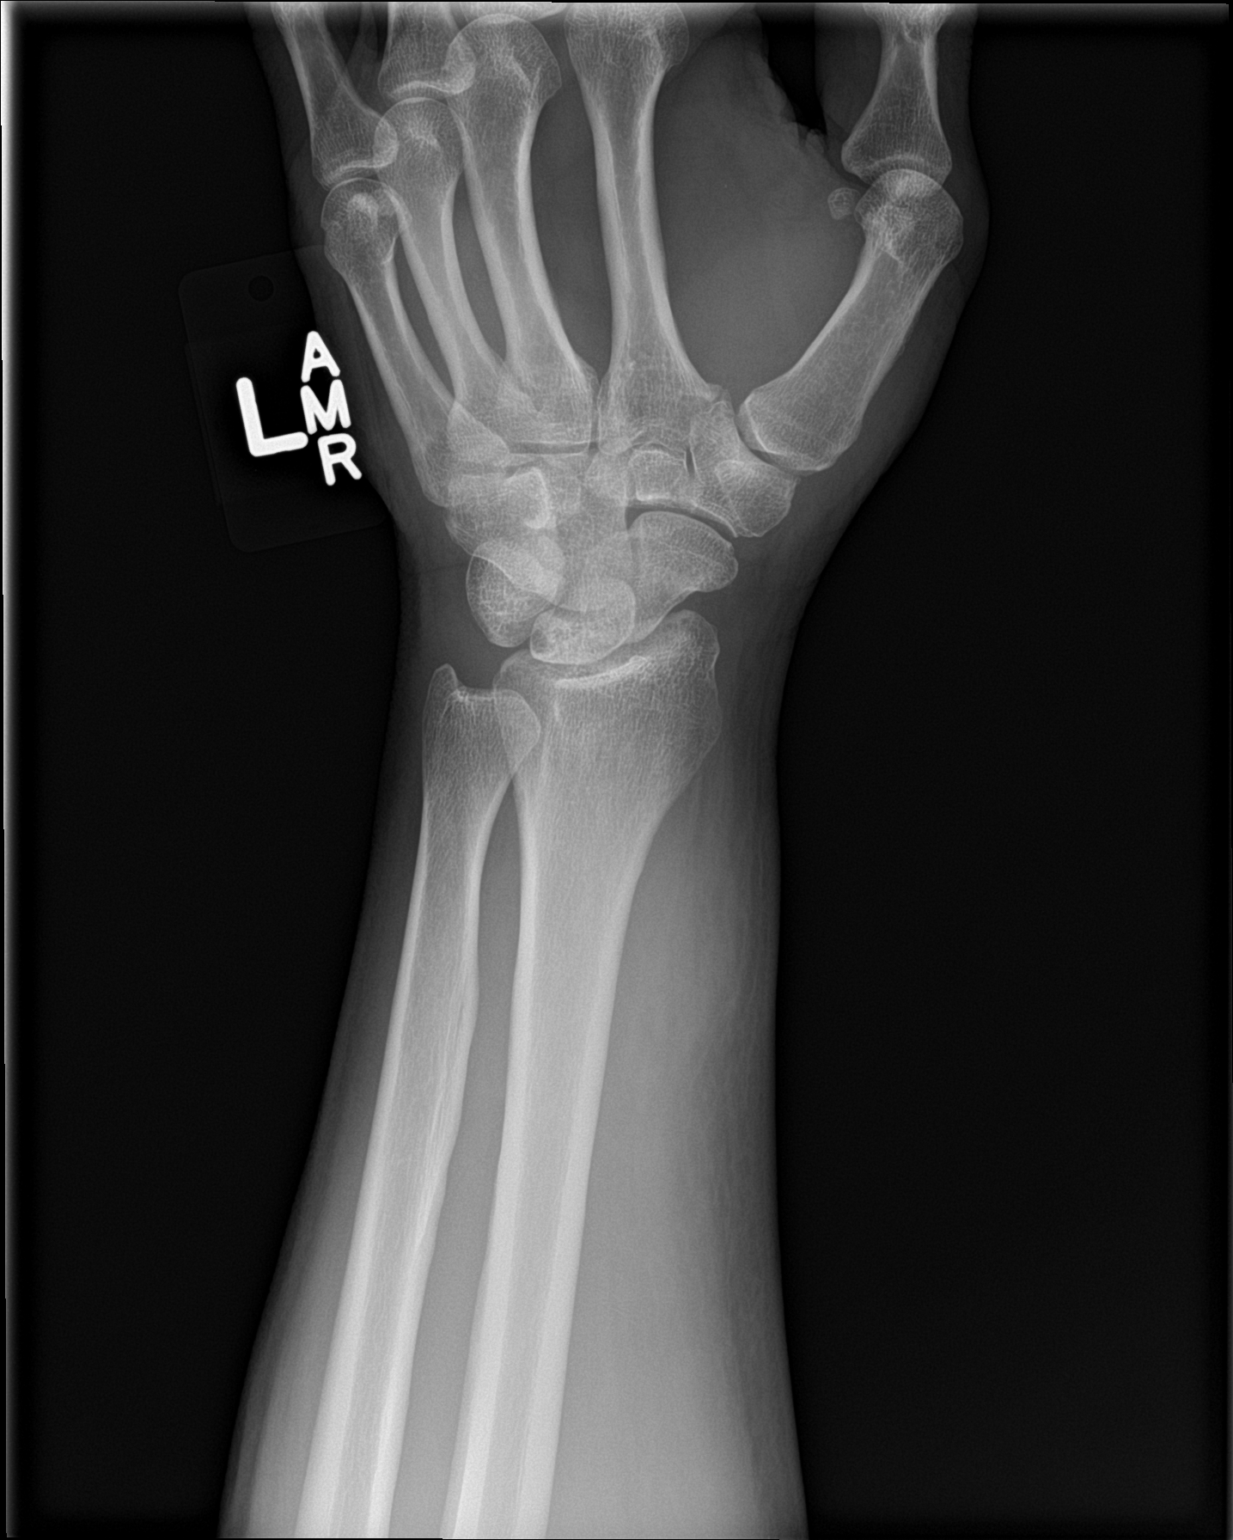

[wrist lat]
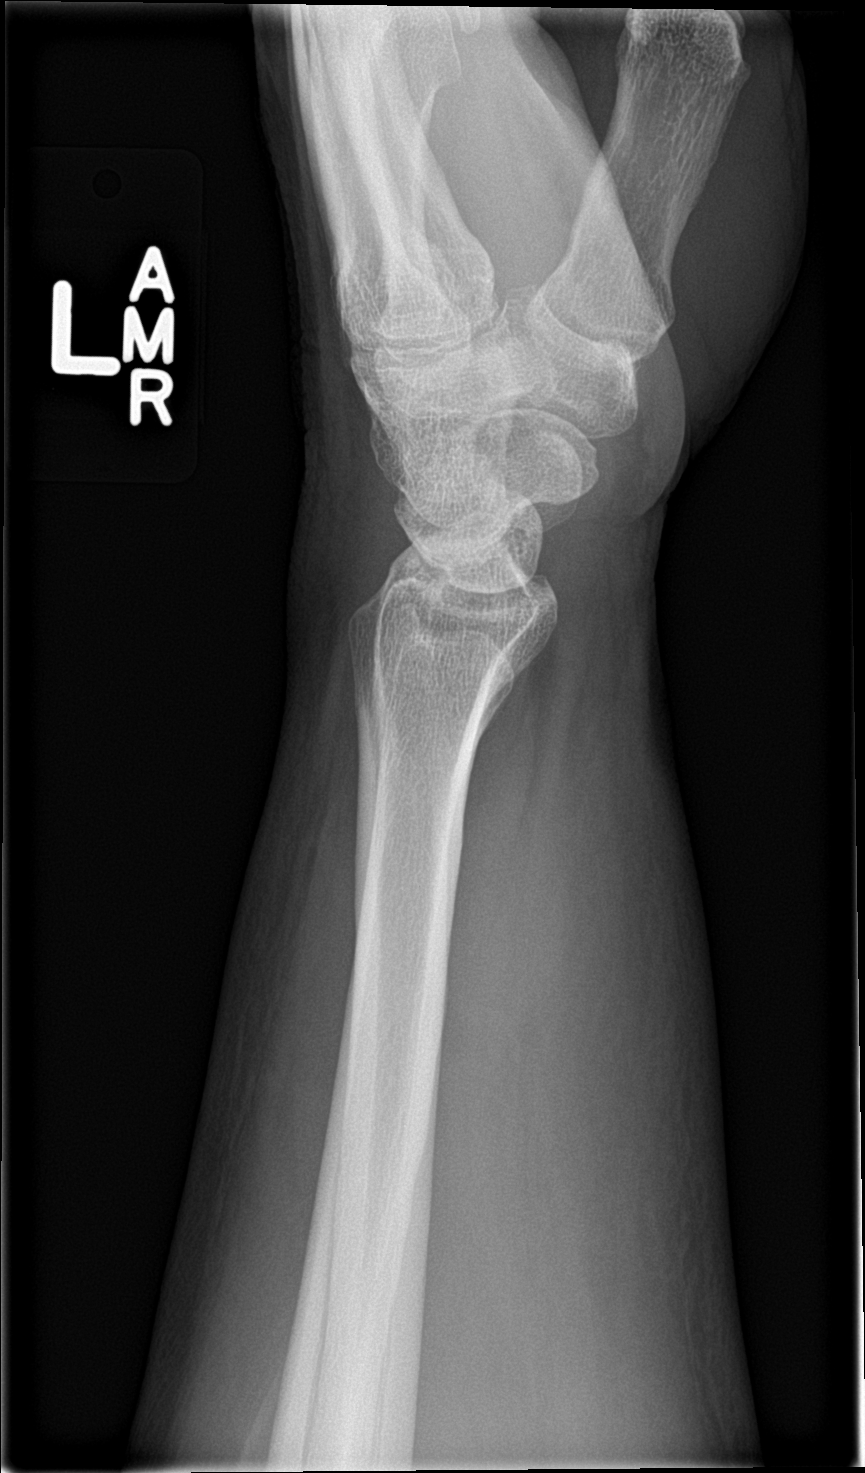

[wrist navicular]
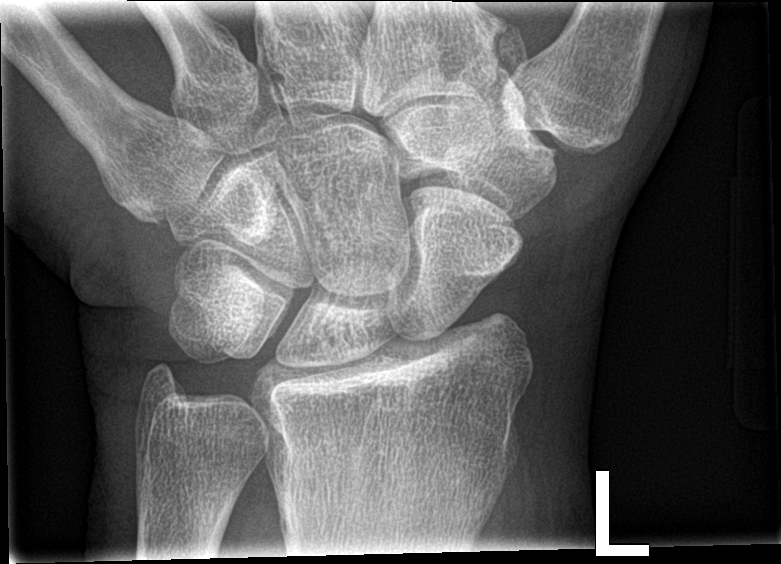

[4 of 4 positions shown; findings below may reference images not displayed]

FINDINGS: Soft tissue hematoma is noted along the lateral aspect of the distal
radius. No acute fracture or dislocation is noted.
IMPRESSION: Soft tissue hematoma without acute bony abnormality.

## 2017-02-22 ENCOUNTER — Other Ambulatory Visit (HOSPITAL_COMMUNITY): Payer: Self-pay | Admitting: Pulmonary Disease

## 2017-02-22 DIAGNOSIS — I10 Essential (primary) hypertension: Secondary | ICD-10-CM

## 2017-02-26 ENCOUNTER — Ambulatory Visit (HOSPITAL_COMMUNITY): Payer: PRIVATE HEALTH INSURANCE

## 2017-02-27 ENCOUNTER — Ambulatory Visit (HOSPITAL_COMMUNITY)
Admission: RE | Admit: 2017-02-27 | Discharge: 2017-02-27 | Disposition: A | Discharge status: Home / Self Care | Payer: PRIVATE HEALTH INSURANCE | Source: Ambulatory Visit | Attending: Pulmonary Disease | Admitting: Pulmonary Disease

## 2017-02-27 DIAGNOSIS — I10 Essential (primary) hypertension: Secondary | ICD-10-CM | POA: Insufficient documentation

## 2017-02-27 DIAGNOSIS — M7989 Other specified soft tissue disorders: Secondary | ICD-10-CM | POA: Diagnosis present

## 2017-02-27 DIAGNOSIS — N200 Calculus of kidney: Secondary | ICD-10-CM | POA: Insufficient documentation

## 2017-02-27 DIAGNOSIS — R2243 Localized swelling, mass and lump, lower limb, bilateral: Secondary | ICD-10-CM | POA: Diagnosis present

## 2017-10-01 ENCOUNTER — Ambulatory Visit (INDEPENDENT_AMBULATORY_CARE_PROVIDER_SITE_OTHER): Payer: PRIVATE HEALTH INSURANCE | Admitting: General Surgery

## 2017-10-01 ENCOUNTER — Encounter: Payer: Self-pay | Admitting: General Surgery

## 2017-10-01 VITALS — BP 140/74 | HR 53 | Temp 96.8°F | Wt 199.0 lb

## 2017-10-01 DIAGNOSIS — Z800 Family history of malignant neoplasm of digestive organs: Secondary | ICD-10-CM | POA: Diagnosis not present

## 2017-10-01 DIAGNOSIS — Z1211 Encounter for screening for malignant neoplasm of colon: Secondary | ICD-10-CM

## 2017-10-01 MED ORDER — PEG 3350-KCL-NABCB-NACL-NASULF 236 G PO SOLR: 4000.0000 mL | Freq: Once | ORAL | 0 refills | Status: AC

## 2017-10-01 NOTE — Progress Notes (Signed)
[name]; [id number]; 07/09/1960   HPI Patient is a 57-year-old white male who returns to my office for a screening colonoscopy.  He was referred by Dr. Hawkins due to an immediate family history of colon cancer.  Patient denies any nausea, vomiting, abdominal pain, weight loss, or blood per rectum.  He has had an anal fistulotomy in 2017.  He currently has no abdominal pain. Past Medical History:  Diagnosis Date  . Chronic back pain   . Degenerative joint disease   . GERD (gastroesophageal reflux disease)   . PONV (postoperative nausea and vomiting)   . Seasonal allergies     Past Surgical History:  Procedure Laterality Date  . ANAL FISTULOTOMY N/A 10/29/2014   Procedure: ANAL FISTULOTOMY;  Surgeon: Iman Orourke, MD;  Location: [facility];  Service: General;  Laterality: N/A;  . COLONOSCOPY N/A 01/06/2013   Procedure: COLONOSCOPY;  Surgeon: Laurence Folz A Shabreka Coulon, MD;  Location: [facility];  Service: Gastroenterology;  Laterality: N/A;  . INCISION AND DRAINAGE ABSCESS N/A 09/18/2013   Procedure: INCISION AND DRAINAGE PERI-RECTAL ABSCESS;  Surgeon: Castor Gittleman A Zamarah Ullmer, MD;  Location: [facility];  Service: General;  Laterality: N/A;  . KNEE ARTHROSCOPY Bilateral   . PLANTAR FASCIA SURGERY Right   . REPAIR TENDONS FOOT    . SHOULDER ARTHROSCOPY Bilateral   . WRIST ARTHROSCOPY Right    torn ligament    History reviewed. No pertinent family history.  Current Outpatient Medications on File Prior to Visit  Medication Sig Dispense Refill  . acetaminophen (TYLENOL) 500 MG tablet Take 500 mg by mouth every 6 (six) hours as needed for headache.    . cetirizine (ZYRTEC) 10 MG tablet Take 10 mg by mouth daily.    . EPINEPHrine (EPIPEN) 0.3 mg/0.3 mL SOAJ injection Inject 0.3 mg into the muscle as needed (allergic reaction).    . HYDROcodone-acetaminophen (NORCO/VICODIN) 5-325 MG tablet Take 1-2 tablets by mouth every 4 (four) hours as needed for moderate pain. 50 tablet 0  . ibuprofen (ADVIL,MOTRIN) 200  MG tablet Take 400-800 mg by mouth every 6 (six) hours as needed for moderate pain.    . MAGNESIUM CITRATE PO Take 1 tablet by mouth daily.    . omeprazole (PRILOSEC OTC) 20 MG tablet Take 20 mg by mouth daily.     No current facility-administered medications on file prior to visit.     Allergies  Allergen Reactions  . Cucumber Extract Anaphylaxis  . Other Anaphylaxis    cantaloupe  . Watermelon [Citrullus Vulgaris] Anaphylaxis    Social History   Substance and Sexual Activity  Alcohol Use Yes   Comment: seldom    Social History   Tobacco Use  Smoking Status Never Smoker  Smokeless Tobacco Never Used    Review of Systems  Constitutional: Negative.   HENT: Negative.   Eyes: Negative.   Respiratory: Negative.   Cardiovascular: Negative.   Gastrointestinal: Negative.   Genitourinary: Negative.   Musculoskeletal: Negative.   Skin: Negative.   Neurological: Negative.   Endo/Heme/Allergies: Negative.   Psychiatric/Behavioral: Negative.     Objective   Vitals:   10/01/17 1134  BP: 140/74  Pulse: (!) 53  Temp: (!) 96.8 F (36 C)    Physical Exam  Constitutional: He is oriented to person, place, and time. He appears well-developed and well-nourished. No distress.  HENT:  Head: Normocephalic and atraumatic.  Cardiovascular: Normal rate, regular rhythm and normal heart sounds. Exam reveals no gallop and no friction rub.  No murmur   heard. Pulmonary/Chest: Effort normal and breath sounds normal. No stridor. No respiratory distress. He has no wheezes. He has no rales.  Abdominal: Soft. Bowel sounds are normal. He exhibits no distension and no mass. There is no tenderness. There is no guarding.  Neurological: He is alert and oriented to person, place, and time.  Skin: Skin is warm and dry.  Vitals reviewed. Previous colonoscopy report reviewed  Assessment  Need for screening colonoscopy, immediate family history of colon cancer in his mother Plan   We will  schedule for screening colonoscopy.  The risks and benefits of the procedure including bleeding and perforation were fully explained to the patient, who gave informed consent.  

## 2017-10-01 NOTE — Patient Instructions (Signed)
Colonoscopy, Adult A colonoscopy is an exam to look at the entire large intestine. During the exam, a lubricated, bendable tube is inserted into the anus and then passed into the rectum, colon, and other parts of the large intestine. A colonoscopy is often done as a part of normal colorectal screening or in response to certain symptoms, such as anemia, persistent diarrhea, abdominal pain, and blood in the stool. The exam can help screen for and diagnose medical problems, including:  Tumors.  Polyps.  Inflammation.  Areas of bleeding.  Tell a health care provider about:  Any allergies you have.  All medicines you are taking, including vitamins, herbs, eye drops, creams, and over-the-counter medicines.  Any problems you or family members have had with anesthetic medicines.  Any blood disorders you have.  Any surgeries you have had.  Any medical conditions you have.  Any problems you have had passing stool. What are the risks? Generally, this is a safe procedure. However, problems may occur, including:  Bleeding.  A tear in the intestine.  A reaction to medicines given during the exam.  Infection (rare).  What happens before the procedure? Eating and drinking restrictions Follow instructions from your health care provider about eating and drinking, which may include:  A few days before the procedure - follow a low-fiber diet. Avoid nuts, seeds, dried fruit, raw fruits, and vegetables.  1-3 days before the procedure - follow a clear liquid diet. Drink only clear liquids, such as clear broth or bouillon, black coffee or tea, clear juice, clear soft drinks or sports drinks, gelatin dessert, and popsicles. Avoid any liquids that contain red or purple dye.  On the day of the procedure - do not eat or drink anything during the 2 hours before the procedure, or within the time period that your health care provider recommends.  Bowel prep If you were prescribed an oral bowel prep  to clean out your colon:  Take it as told by your health care provider. Starting the day before your procedure, you will need to drink a large amount of medicated liquid. The liquid will cause you to have multiple loose stools until your stool is almost clear or light green.  If your skin or anus gets irritated from diarrhea, you may use these to relieve the irritation: ? Medicated wipes, such as adult wet wipes with aloe and vitamin E. ? A skin soothing-product like petroleum jelly.  If you vomit while drinking the bowel prep, take a break for up to 60 minutes and then begin the bowel prep again. If vomiting continues and you cannot take the bowel prep without vomiting, call your health care provider.  General instructions  Ask your health care provider about changing or stopping your regular medicines. This is especially important if you are taking diabetes medicines or blood thinners.  Plan to have someone take you home from the hospital or clinic. What happens during the procedure?  An IV tube may be inserted into one of your veins.  You will be given medicine to help you relax (sedative).  To reduce your risk of infection: ? Your health care team will wash or sanitize their hands. ? Your anal area will be washed with soap.  You will be asked to lie on your side with your knees bent.  Your health care provider will lubricate a long, thin, flexible tube. The tube will have a camera and a light on the end.  The tube will be inserted into your   anus.  The tube will be gently eased through your rectum and colon.  Air will be delivered into your colon to keep it open. You may feel some pressure or cramping.  The camera will be used to take images during the procedure.  A small tissue sample may be removed from your body to be examined under a microscope (biopsy). If any potential problems are found, the tissue will be sent to a lab for testing.  If small polyps are found, your  health care provider may remove them and have them checked for cancer cells.  The tube that was inserted into your anus will be slowly removed. The procedure may vary among health care providers and hospitals. What happens after the procedure?  Your blood pressure, heart rate, breathing rate, and blood oxygen level will be monitored until the medicines you were given have worn off.  Do not drive for 24 hours after the exam.  You may have a small amount of blood in your stool.  You may pass gas and have mild abdominal cramping or bloating due to the air that was used to inflate your colon during the exam.  It is up to you to get the results of your procedure. Ask your health care provider, or the department performing the procedure, when your results will be ready. This information is not intended to replace advice given to you by your health care provider. Make sure you discuss any questions you have with your health care provider. Document Released: 12/30/1999 Document Revised: 11/02/2015 Document Reviewed: 03/15/2015 Elsevier Interactive Patient Education  2018 [employer]  

## 2017-10-15 NOTE — H&P (Signed)
[name]; [id number]; Aug 11, 1960   HPI Patient is a 57 year old white male who returns to my office for a screening colonoscopy.  He was referred by Dr. Juanetta Gosling due to an immediate family history of colon cancer.  Patient denies any nausea, vomiting, abdominal pain, weight loss, or blood per rectum.  He has had an anal fistulotomy in 2017.  He currently has no abdominal pain. Past Medical History:  Diagnosis Date  . Chronic back pain   . Degenerative joint disease   . GERD (gastroesophageal reflux disease)   . PONV (postoperative nausea and vomiting)   . Seasonal allergies     Past Surgical History:  Procedure Laterality Date  . ANAL FISTULOTOMY N/A 10/29/2014   Procedure: ANAL FISTULOTOMY;  Surgeon: Franky Macho, MD;  Location: [facility];  Service: General;  Laterality: N/A;  . COLONOSCOPY N/A 01/06/2013   Procedure: COLONOSCOPY;  Surgeon: Dalia Heading, MD;  Location: [facility];  Service: Gastroenterology;  Laterality: N/A;  . INCISION AND DRAINAGE ABSCESS N/A 09/18/2013   Procedure: INCISION AND DRAINAGE PERI-RECTAL ABSCESS;  Surgeon: Dalia Heading, MD;  Location: [facility];  Service: General;  Laterality: N/A;  . KNEE ARTHROSCOPY Bilateral   . PLANTAR FASCIA SURGERY Right   . REPAIR TENDONS FOOT    . SHOULDER ARTHROSCOPY Bilateral   . WRIST ARTHROSCOPY Right    torn ligament    History reviewed. No pertinent family history.  Current Outpatient Medications on File Prior to Visit  Medication Sig Dispense Refill  . acetaminophen (TYLENOL) 500 MG tablet Take 500 mg by mouth every 6 (six) hours as needed for headache.    . cetirizine (ZYRTEC) 10 MG tablet Take 10 mg by mouth daily.    Marland Kitchen EPINEPHrine (EPIPEN) 0.3 mg/0.3 mL SOAJ injection Inject 0.3 mg into the muscle as needed (allergic reaction).    Marland Kitchen HYDROcodone-acetaminophen (NORCO/VICODIN) 5-325 MG tablet Take 1-2 tablets by mouth every 4 (four) hours as needed for moderate pain. 50 tablet 0  . ibuprofen (ADVIL,MOTRIN) 200  MG tablet Take 400-800 mg by mouth every 6 (six) hours as needed for moderate pain.    Marland Kitchen MAGNESIUM CITRATE PO Take 1 tablet by mouth daily.    Marland Kitchen omeprazole (PRILOSEC OTC) 20 MG tablet Take 20 mg by mouth daily.     No current facility-administered medications on file prior to visit.     Allergies  Allergen Reactions  . Cucumber Extract Anaphylaxis  . Other Anaphylaxis    cantaloupe  . Watermelon [Citrullus Vulgaris] Anaphylaxis    Social History   Substance and Sexual Activity  Alcohol Use Yes   Comment: seldom    Social History   Tobacco Use  Smoking Status Never Smoker  Smokeless Tobacco Never Used    Review of Systems  Constitutional: Negative.   HENT: Negative.   Eyes: Negative.   Respiratory: Negative.   Cardiovascular: Negative.   Gastrointestinal: Negative.   Genitourinary: Negative.   Musculoskeletal: Negative.   Skin: Negative.   Neurological: Negative.   Endo/Heme/Allergies: Negative.   Psychiatric/Behavioral: Negative.     Objective   Vitals:   10/01/17 1134  BP: 140/74  Pulse: (!) 53  Temp: (!) 96.8 F (36 C)    Physical Exam  Constitutional: He is oriented to person, place, and time. He appears well-developed and well-nourished. No distress.  HENT:  Head: Normocephalic and atraumatic.  Cardiovascular: Normal rate, regular rhythm and normal heart sounds. Exam reveals no gallop and no friction rub.  No murmur  heard. Pulmonary/Chest: Effort normal and breath sounds normal. No stridor. No respiratory distress. He has no wheezes. He has no rales.  Abdominal: Soft. Bowel sounds are normal. He exhibits no distension and no mass. There is no tenderness. There is no guarding.  Neurological: He is alert and oriented to person, place, and time.  Skin: Skin is warm and dry.  Vitals reviewed. Previous colonoscopy report reviewed  Assessment  Need for screening colonoscopy, immediate family history of colon cancer in his mother Plan   We will  schedule for screening colonoscopy.  The risks and benefits of the procedure including bleeding and perforation were fully explained to the patient, who gave informed consent.

## 2017-10-31 ENCOUNTER — Ambulatory Visit (INDEPENDENT_AMBULATORY_CARE_PROVIDER_SITE_OTHER): Payer: PRIVATE HEALTH INSURANCE

## 2017-10-31 ENCOUNTER — Encounter: Payer: Self-pay | Admitting: Podiatry

## 2017-10-31 ENCOUNTER — Ambulatory Visit (INDEPENDENT_AMBULATORY_CARE_PROVIDER_SITE_OTHER): Payer: PRIVATE HEALTH INSURANCE | Admitting: Podiatry

## 2017-10-31 VITALS — BP 162/94 | HR 56

## 2017-10-31 DIAGNOSIS — M779 Enthesopathy, unspecified: Secondary | ICD-10-CM

## 2017-10-31 DIAGNOSIS — M778 Other enthesopathies, not elsewhere classified: Secondary | ICD-10-CM

## 2017-10-31 DIAGNOSIS — L603 Nail dystrophy: Secondary | ICD-10-CM | POA: Diagnosis not present

## 2017-10-31 DIAGNOSIS — M2031 Hallux varus (acquired), right foot: Secondary | ICD-10-CM | POA: Diagnosis not present

## 2017-10-31 NOTE — Progress Notes (Signed)
Subjective:  Patient ID: [name], male    DOB: [birth year],  MRN: [id number] HPI Chief Complaint  Patient presents with  . Toe Pain    right foot, great toe joint is pain,swollen since 3-4 months with no improvements  . Nail Problem    B/ l great toenails are thick,discolored since 6-12 months    57 y.o. male presents with the above complaint.   ROS: Denies fever chills nausea vomiting muscle aches pains calf pain back pain chest pain shortness of breath.    Past Medical History:  Diagnosis Date  . Chronic back pain   . Degenerative joint disease   . GERD (gastroesophageal reflux disease)   . PONV (postoperative nausea and vomiting)   . Seasonal allergies    Past Surgical History:  Procedure Laterality Date  . ANAL FISTULOTOMY N/A 10/29/2014   Procedure: ANAL FISTULOTOMY;  Surgeon: Timothy Macho, MD;  Location: [facility];  Service: General;  Laterality: N/A;  . COLONOSCOPY N/A 01/06/2013   Procedure: COLONOSCOPY;  Surgeon: Timothy Heading, MD;  Location: [facility];  Service: Gastroenterology;  Laterality: N/A;  . INCISION AND DRAINAGE ABSCESS N/A 09/18/2013   Procedure: INCISION AND DRAINAGE PERI-RECTAL ABSCESS;  Surgeon: Timothy Heading, MD;  Location: [facility];  Service: General;  Laterality: N/A;  . KNEE ARTHROSCOPY Bilateral   . PLANTAR FASCIA SURGERY Right   . REPAIR TENDONS FOOT    . SHOULDER ARTHROSCOPY Bilateral   . WRIST ARTHROSCOPY Right    torn ligament    Current Outpatient Medications:  .  diclofenac (VOLTAREN) 75 MG EC tablet, TK 1 T PO BID WF PRN, Disp: , Rfl: 1 .  EPINEPHrine 0.3 mg/0.3 mL IJ SOAJ injection, Inject into the muscle., Disp: , Rfl:  .  Nebivolol HCl (BYSTOLIC) 20 MG TABS, Take by mouth daily., Disp: , Rfl:   Allergies  Allergen Reactions  . Citrullus Vulgaris Anaphylaxis and Other (See Comments)  . Cucumber Extract Anaphylaxis  . Other Anaphylaxis    cantaloupe   Review of Systems Objective:   Vitals:   10/31/17 0939  BP: (!)  162/94  Pulse: (!) 56    General: Well developed, nourished, in no acute distress, alert and oriented x3   Dermatological: Skin is warm, dry and supple bilateral. Nails x 10 are well maintained; remaining integument appears unremarkable at this time. There are no open sores, no preulcerative lesions, no rash or signs of infection present.  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  Vascular: Dorsalis Pedis artery and Posterior Tibial artery pedal pulses are 2/4 bilateral with immedate capillary fill time. Pedal hair growth present. No varicosities and no lower extremity edema present bilateral.   Neruologic: Grossly intact via light touch bilateral. Vibratory intact via tuning fork bilateral. Protective threshold with Semmes Wienstein monofilament intact to all pedal sites bilateral. Patellar and Achilles deep tendon reflexes 2+ bilateral. No Babinski or clonus noted bilateral.   Musculoskeletal: No gross boney pedal deformities bilateral. No pain, crepitus, or limitation noted with foot and ankle range of motion bilateral. Muscular strength 5/5 in all groups tested bilateral.  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  Gait: Unassisted, Nonantalgic.    Radiographs:  Radiographs taken today demonstrate osteoarthritic changes of the first metatarsophalangeal joint of the right foot and osteoarthritic changes of the hallux interphalangeal joint.  No fractures or acute injuries.  Assessment & Plan:   Assessment: Nail dystrophy.  Hallux malleus  with osteoarthritis.  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    Plan: Discussed etiology pathology conservative surgical therapies at this point took samples of the toenails to be sent for pathologic evaluation.  Discussed in great detail today the need for surgical intervention and how this would proceed consisting of a  hallux interphalangeal joint fusion as well as a [name] with osteotomy with screw fixation.  Follow-up with him once the pathology has come back in.     [name], [place]

## 2017-11-12 ENCOUNTER — Other Ambulatory Visit: Payer: Self-pay

## 2017-11-12 ENCOUNTER — Encounter (HOSPITAL_COMMUNITY): Payer: Self-pay | Admitting: *Deleted

## 2017-11-12 ENCOUNTER — Encounter (HOSPITAL_COMMUNITY)
Admission: RE | Disposition: A | Discharge status: Home Health | Payer: Self-pay | Source: Ambulatory Visit | Attending: General Surgery

## 2017-11-12 ENCOUNTER — Ambulatory Visit (HOSPITAL_COMMUNITY)
Admission: RE | Admit: 2017-11-12 | Discharge: 2017-11-12 | Disposition: A | Discharge status: Home Health | Payer: PRIVATE HEALTH INSURANCE | Source: Ambulatory Visit | Attending: General Surgery | Admitting: General Surgery

## 2017-11-12 DIAGNOSIS — Z800 Family history of malignant neoplasm of digestive organs: Secondary | ICD-10-CM | POA: Diagnosis not present

## 2017-11-12 DIAGNOSIS — Z79899 Other long term (current) drug therapy: Secondary | ICD-10-CM | POA: Diagnosis not present

## 2017-11-12 DIAGNOSIS — K219 Gastro-esophageal reflux disease without esophagitis: Secondary | ICD-10-CM | POA: Diagnosis not present

## 2017-11-12 DIAGNOSIS — Z1211 Encounter for screening for malignant neoplasm of colon: Secondary | ICD-10-CM | POA: Diagnosis not present

## 2017-11-12 HISTORY — PX: COLONOSCOPY: SHX5424

## 2017-11-12 HISTORY — DX: Essential (primary) hypertension: I10

## 2017-11-12 SURGERY — COLONOSCOPY
Anesthesia: Moderate Sedation

## 2017-11-12 MED ORDER — MIDAZOLAM HCL 5 MG/5ML IJ SOLN
INTRAMUSCULAR | Status: DC | PRN
  Administered 2017-11-12: 3 mg via INTRAVENOUS
  Administered 2017-11-12: 1 mg via INTRAVENOUS

## 2017-11-12 MED ORDER — MEPERIDINE HCL 100 MG/ML IJ SOLN
INTRAMUSCULAR | Status: AC
  Filled 2017-11-12: qty 1

## 2017-11-12 MED ORDER — STERILE WATER FOR IRRIGATION IR SOLN
Status: DC | PRN
  Administered 2017-11-12: 1.5 mL

## 2017-11-12 MED ORDER — MEPERIDINE HCL 50 MG/ML IJ SOLN
INTRAMUSCULAR | Status: DC | PRN
  Administered 2017-11-12: 50 mg via INTRAVENOUS
  Administered 2017-11-12: 25 mg via INTRAVENOUS

## 2017-11-12 MED ORDER — MIDAZOLAM HCL 5 MG/5ML IJ SOLN
INTRAMUSCULAR | Status: AC
  Filled 2017-11-12: qty 5

## 2017-11-12 MED ORDER — SODIUM CHLORIDE 0.9 % IV SOLN
INTRAVENOUS | Status: DC
  Administered 2017-11-12: 07:00:00 via INTRAVENOUS

## 2017-11-12 NOTE — Op Note (Signed)
[facility] Patient Name: [name] Procedure Date: 11/12/2017 7:04 AM MRN: [id number] Date of Birth: 1960-07-16 Attending MD: Franky Macho , MD CSN: [id number] Age: 57 Admit Type: Outpatient Procedure:                Colonoscopy Indications:              Screening in patient at increased risk: Family                            history of 1st-degree relative with colorectal                            cancer Providers:                Franky Macho, MD, Jannett Celestine, RN, Nena Polio, RN Referring MD:              Medicines:                Midazolam 4 mg IV, Meperidine 75 mg IV Complications:            No immediate complications. Estimated Blood Loss:     Estimated blood loss: none. Procedure:                Pre-Anesthesia Assessment:                           - Prior to the procedure, a History and Physical                            was performed, and patient medications and                            allergies were reviewed. The patient is competent.                            The risks and benefits of the procedure and the                            sedation options and risks were discussed with the                            patient. All questions were answered and informed                            consent was obtained. Patient identification and                            proposed procedure were verified by the physician,                            the nurse and the technician in the endoscopy                            suite. Mental Status Examination: alert and  oriented. Airway Examination: normal oropharyngeal                            airway and neck mobility. Respiratory Examination:                            clear to auscultation. CV Examination: RRR, no                            murmurs, no S3 or S4. Prophylactic Antibiotics: The                            patient does not require prophylactic antibiotics.   Prior Anticoagulants: The patient has taken no                            previous anticoagulant or antiplatelet agents. ASA                            Grade Assessment: II - A patient with mild systemic                            disease. After reviewing the risks and benefits,                            the patient was deemed in satisfactory condition to                            undergo the procedure. The anesthesia plan was to                            use moderate sedation / analgesia (conscious                            sedation). Immediately prior to administration of                            medications, the patient was re-assessed for                            adequacy to receive sedatives. The heart rate,                            respiratory rate, oxygen saturations, blood                            pressure, adequacy of pulmonary ventilation, and                            response to care were monitored throughout the                            procedure. The physical status of the patient was  re-assessed after the procedure.                           After obtaining informed consent, the colonoscope                            was passed under direct vision. Throughout the                            procedure, the patient's blood pressure, pulse, and                            oxygen saturations were monitored continuously. The                            [id number] ([id number]) scope was introduced through                            the anus and advanced to the the cecum, identified                            by the appendiceal orifice, ileocecal valve and                            palpation. No anatomical landmarks were                            photographed. The entire colon was well visualized.                            The colonoscopy was performed with ease. The                            patient tolerated the procedure well. The total                             duration of the procedure was 11 minutes. Scope In: 7:35:12 AM Scope Out: 7:41:41 AM Scope Withdrawal Time: 0 hours 3 minutes 17 seconds  Total Procedure Duration: 0 hours 6 minutes 29 seconds  Findings:      The perianal and digital rectal examinations were normal.      The entire examined colon appeared normal on direct and retroflexion       views. Impression:               - The entire examined colon is normal on direct and                            retroflexion views.                           - No specimens collected. Moderate Sedation:      Moderate (conscious) sedation was administered by the endoscopy nurse       and supervised by the endoscopist. The following parameters were       monitored: oxygen saturation, heart rate, blood  pressure, and response       to care. Recommendation:           - Patient has a contact number available for                            emergencies. The signs and symptoms of potential                            delayed complications were discussed with the                            patient. Return to normal activities tomorrow.                            Written discharge instructions were provided to the                            patient.                           - Written discharge instructions were provided to                            the patient.                           - The signs and symptoms of potential delayed                            complications were discussed with the patient.                           - Patient has a contact number available for                            emergencies.                           - Return to normal activities tomorrow.                           - Resume previous diet.                           - Continue present medications.                           - Repeat colonoscopy in 5 years. Procedure Code(s):        --- Professional ---                           [phone],  Colonoscopy, flexible; diagnostic, including                            collection of specimen(s) by brushing or washing,  when performed (separate procedure) Diagnosis Code(s):        --- Professional ---                           Z80.0, Family history of malignant neoplasm of                            digestive organs CPT copyright 2018 American [employer] Association. All rights reserved. The codes documented in this report are preliminary and upon coder review may  be revised to meet current compliance requirements. Franky Macho, MD Franky Macho, MD 11/12/2017 7:48:45 AM This report has been signed electronically. Number of Addenda: 0

## 2017-11-12 NOTE — Discharge Instructions (Signed)
Colonoscopy, Adult, Care After  This sheet gives you information about how to care for yourself after your procedure. Your health care provider may also give you more specific instructions. If you have problems or questions, contact your health care provider.  What can I expect after the procedure?  After the procedure, it is common to have:  · A small amount of blood in your stool for 24 hours after the procedure.  · Some gas.  · Mild abdominal cramping or bloating.    Follow these instructions at home:  General instructions    · For the first 24 hours after the procedure:  ? Do not drive or use machinery.  ? Do not sign important documents.  ? Do not drink alcohol.  ? Do your regular daily activities at a slower pace than normal.  ? Eat soft, easy-to-digest foods.  ? Rest often.  · Take over-the-counter or prescription medicines only as told by your health care provider.  · It is up to you to get the results of your procedure. Ask your health care provider, or the department performing the procedure, when your results will be ready.  Relieving cramping and bloating  · Try walking around when you have cramps or feel bloated.  · Apply heat to your abdomen as told by your health care provider. Use a heat source that your health care provider recommends, such as a moist heat pack or a heating pad.  ? Place a towel between your skin and the heat source.  ? Leave the heat on for 20-30 minutes.  ? Remove the heat if your skin turns bright red. This is especially important if you are unable to feel pain, heat, or cold. You may have a greater risk of getting burned.  Eating and drinking  · Drink enough fluid to keep your urine clear or pale yellow.  · Resume your normal diet as instructed by your health care provider. Avoid heavy or fried foods that are hard to digest.  · Avoid drinking alcohol for as long as instructed by your health care provider.  Contact a health care provider if:  · You have blood in your stool 2-3  days after the procedure.  Get help right away if:  · You have more than a small spotting of blood in your stool.  · You pass large blood clots in your stool.  · Your abdomen is swollen.  · You have nausea or vomiting.  · You have a fever.  · You have increasing abdominal pain that is not relieved with medicine.  This information is not intended to replace advice given to you by your health care provider. Make sure you discuss any questions you have with your health care provider.  Document Released: 08/16/2003 Document Revised: 09/26/2015 Document Reviewed: 03/15/2015  Elsevier Interactive Patient Education © 2018 [employer]

## 2017-11-12 NOTE — Interval H&P Note (Signed)
History and Physical Interval Note:  11/12/2017 7:28 AM  Mathews Argyle  has presented today for surgery, with the diagnosis of screening, family history of mother colon cancer  The various methods of treatment have been discussed with the patient and family. After consideration of risks, benefits and other options for treatment, the patient has consented to  Procedure(s): COLONOSCOPY (N/A) as a surgical intervention .  The patient's history has been reviewed, patient examined, no change in status, stable for surgery.  I have reviewed the patient's chart and labs.  Questions were answered to the patient's satisfaction.     Franky Macho

## 2017-11-15 ENCOUNTER — Encounter (HOSPITAL_COMMUNITY): Payer: Self-pay | Admitting: General Surgery

## 2017-11-15 ENCOUNTER — Encounter: Payer: Self-pay | Admitting: Podiatry

## 2017-11-20 ENCOUNTER — Telehealth: Payer: Self-pay | Admitting: *Deleted

## 2017-11-20 MED ORDER — NUVAIL EX SOLN: 1.0000 [drp] | Freq: Every day | CUTANEOUS | 11 refills | Status: DC

## 2017-11-20 NOTE — Telephone Encounter (Signed)
-----   Message from Max T Hyatt, DPM sent at 11/19/2017  7:42 AM EST ----- Neg for fungus so use nuvail.  Let patinet know and cancel fu if not necessary. 

## 2017-11-20 NOTE — Telephone Encounter (Signed)
-----   Message from Elinor Parkinson, [place] sent at 11/19/2017  7:42 AM EST ----- Neg for fungus so use nuvail.  Let patinet know and cancel fu if not necessary.

## 2017-11-20 NOTE — Telephone Encounter (Signed)
Mailed NuVail coupon.

## 2017-11-20 NOTE — Telephone Encounter (Signed)
Pt called, I informed of Dr. Geryl Rankins review of results and orders. Pt states he would like to use the NuVail. I told him I would send to his pharmacy and mail his coupon.

## 2017-11-20 NOTE — Telephone Encounter (Signed)
Left message on pt's home phone requesting a call back to discuss results and asked pt if he needed to see Dr. Al Corpus for another problem or cancel the 12/03/2017 appt.

## 2017-11-20 NOTE — Telephone Encounter (Signed)
Unable to leave a message on pt's voicemail, mailbox is full.

## 2017-12-03 ENCOUNTER — Ambulatory Visit: Payer: PRIVATE HEALTH INSURANCE | Admitting: Podiatry

## 2018-03-27 DIAGNOSIS — M25531 Pain in right wrist: Secondary | ICD-10-CM | POA: Insufficient documentation

## 2018-03-27 DIAGNOSIS — M25532 Pain in left wrist: Secondary | ICD-10-CM | POA: Insufficient documentation

## 2018-04-24 IMAGING — US US RENAL
1 series · 14 of 25 positions shown · non-contrast
Comparison: None.

CLINICAL DATA: Hypertension

EXAM:
RENAL / URINARY TRACT ULTRASOUND COMPLETE

[Series 1: us renal · 0.24mm/px · 14 of 104 slices shown]
[im 1/104]
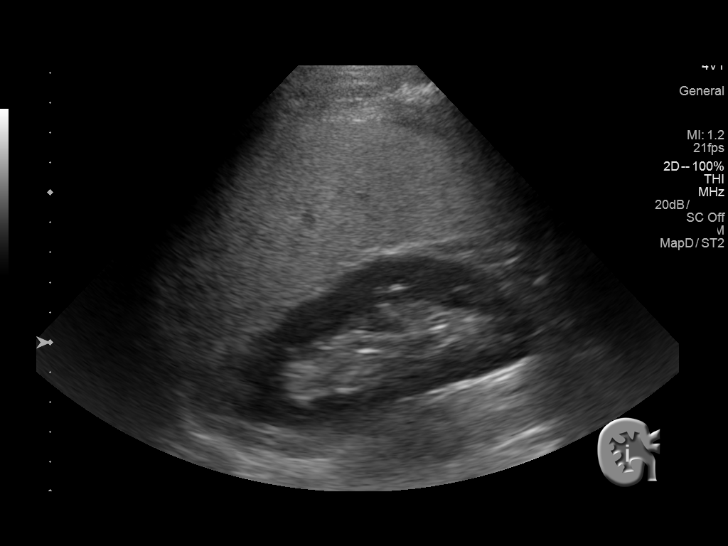
[im 9/104]
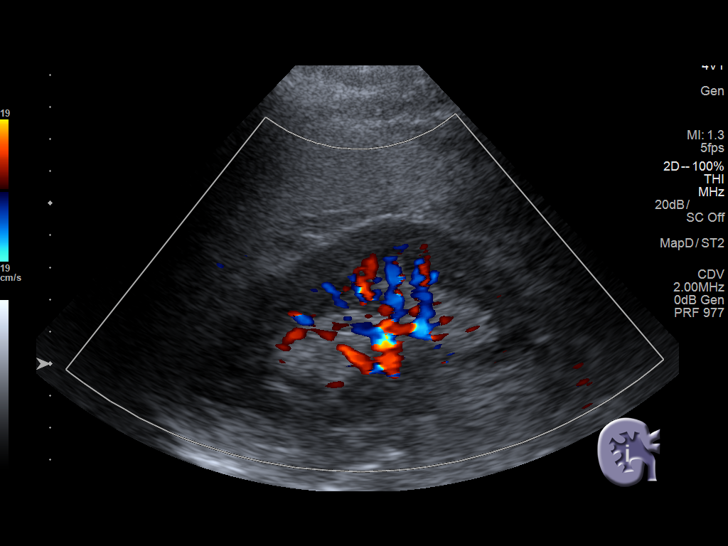
[im 18/104]
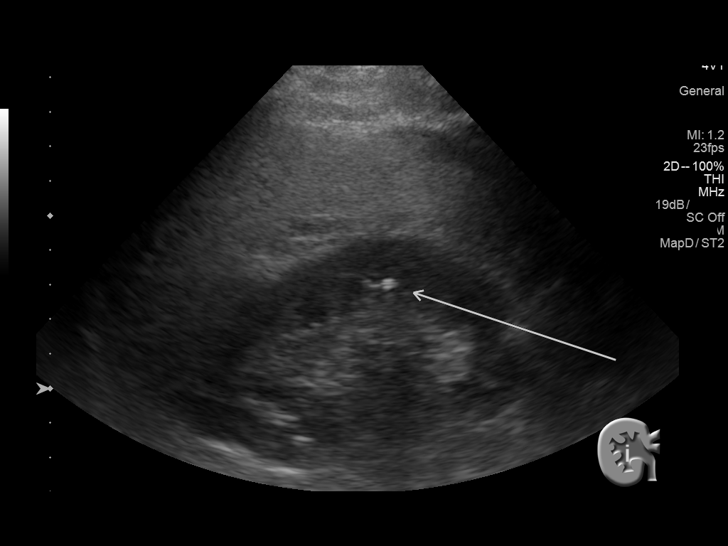
[im 26/104]
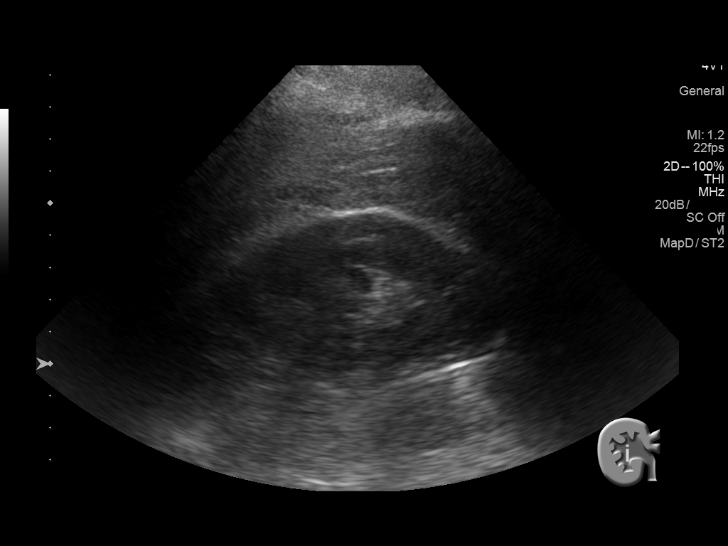
[im 35/104]
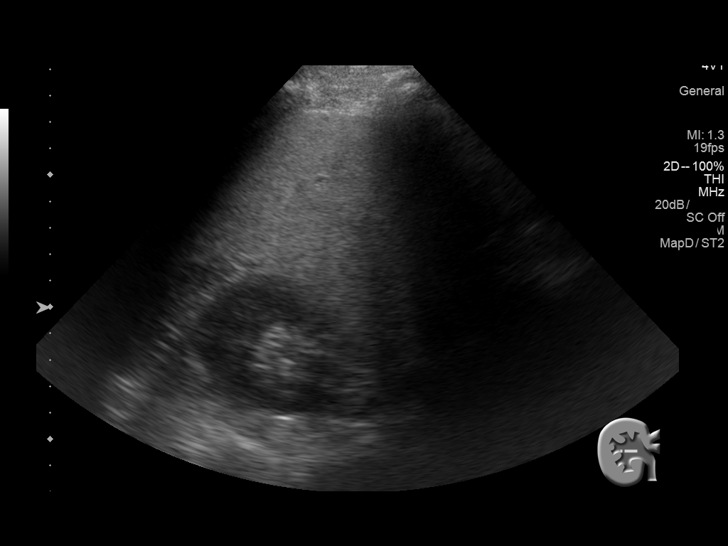
[im 39/104]
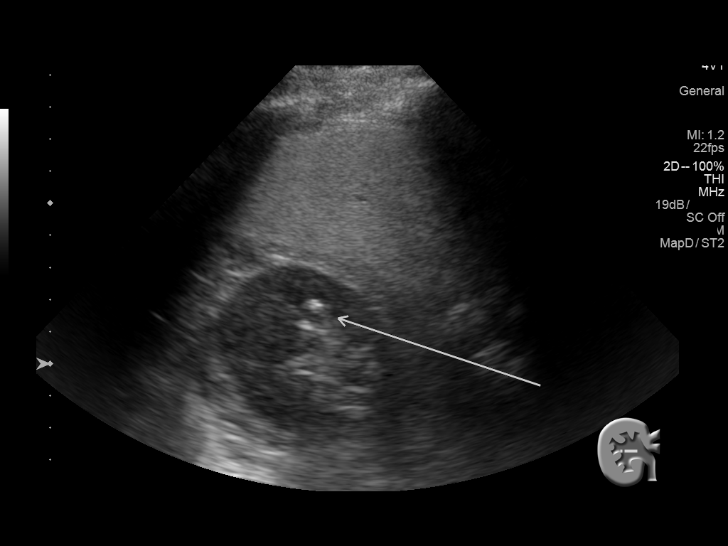
[im 48/104]
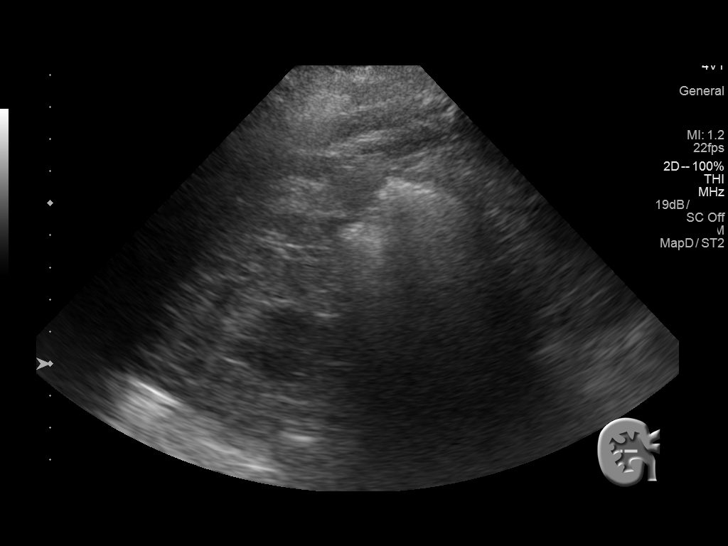
[im 56/104]
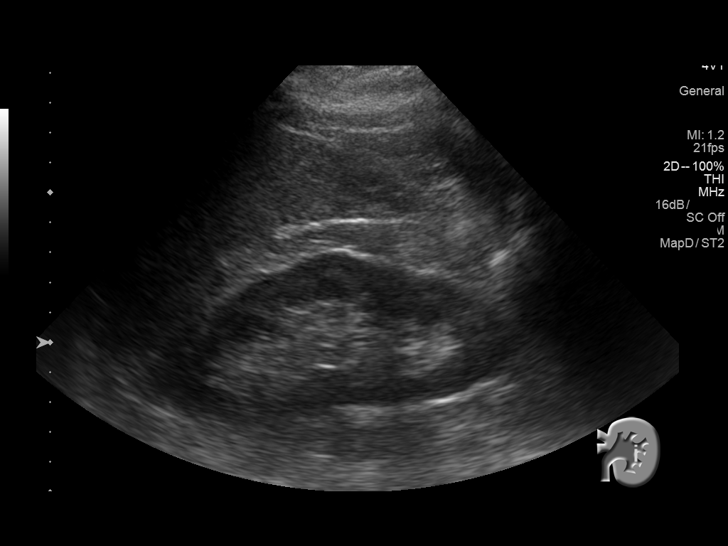
[im 65/104]
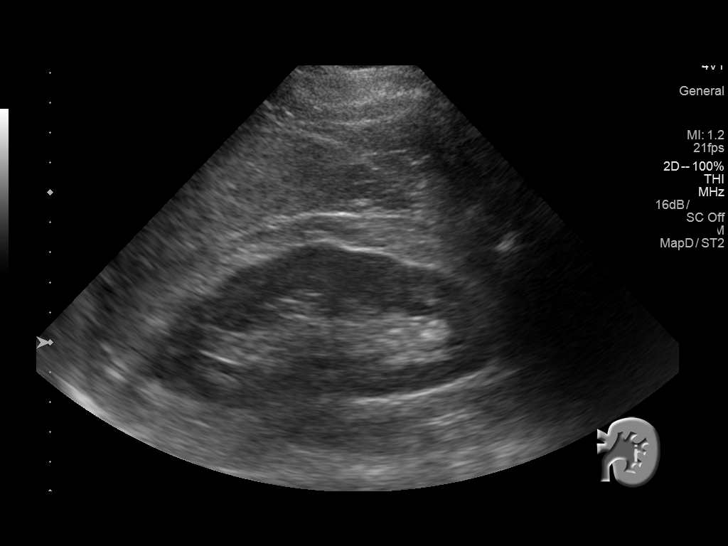
[im 69/104]
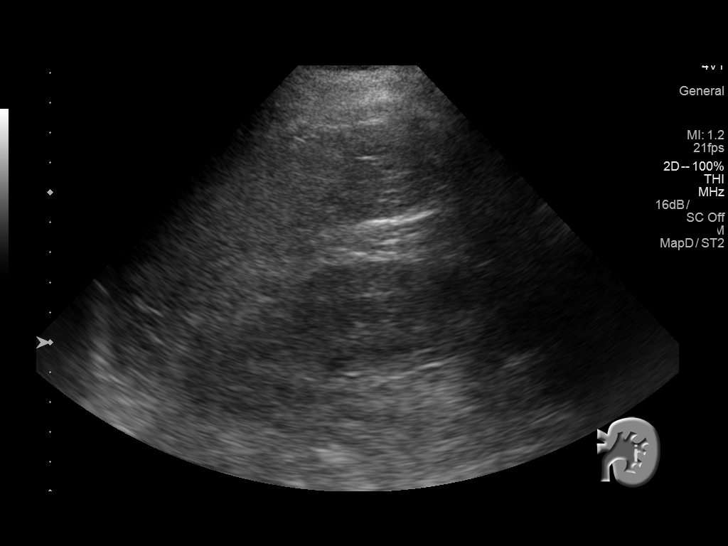
[im 78/104]
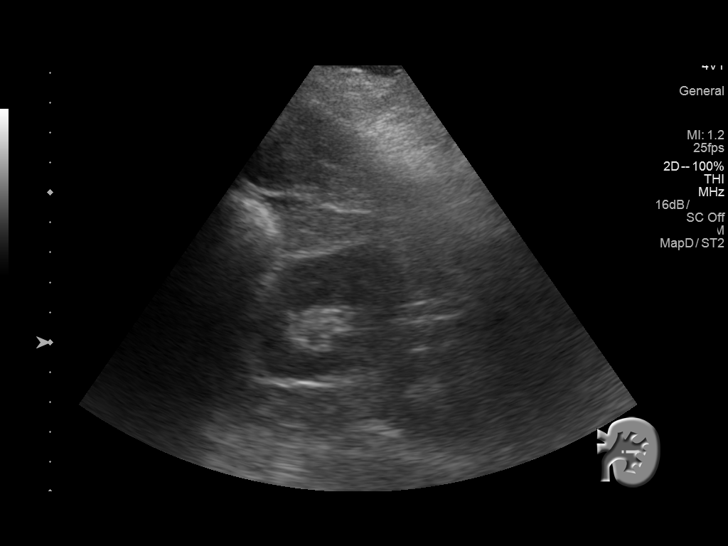
[im 86/104]
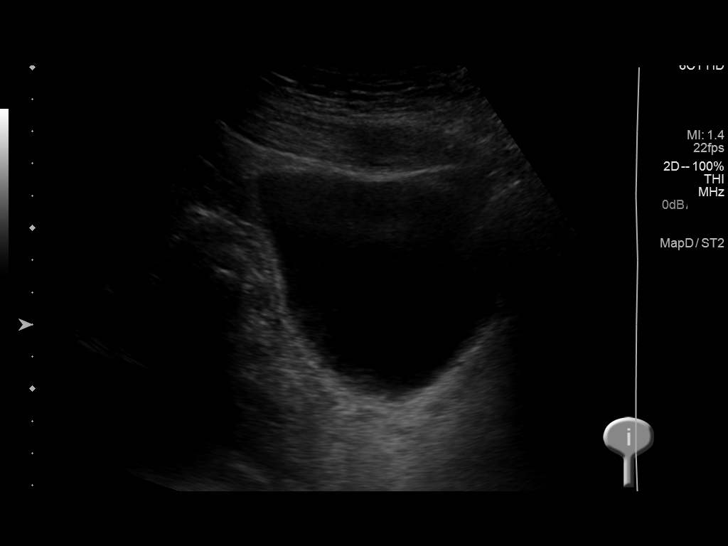
[im 95/104]
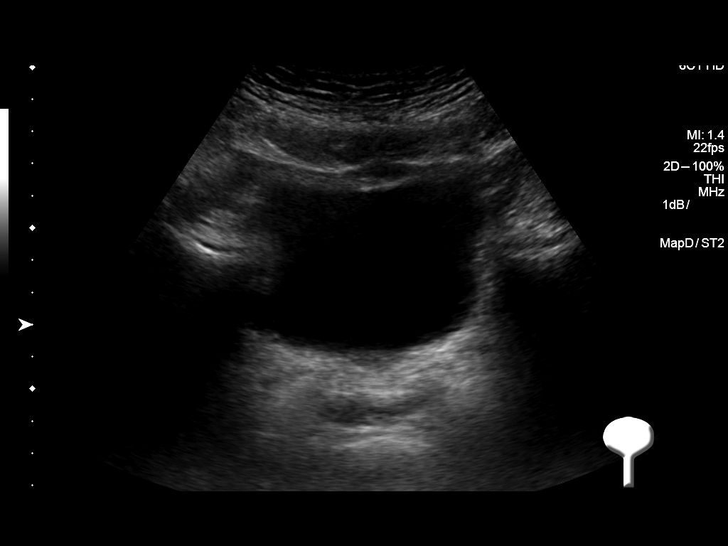
[im 104/104]
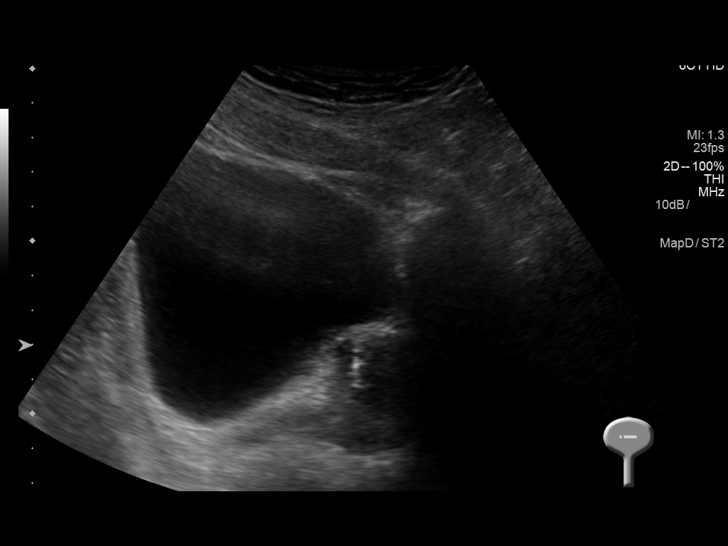

[14 of 25 positions shown; findings below may reference images not displayed]

FINDINGS: Right Kidney:

Length: 10.8 cm. Echogenicity within normal limits. 4 mm echogenic
nonshadowing focus in the right midpole likely reflecting a
nonobstructing calculus. No mass or hydronephrosis visualized.

Left Kidney:

Length: 12 cm. Echogenicity within normal limits. No mass or
hydronephrosis visualized.

Bladder:

Appears normal for degree of bladder distention.

Other:

Increased hepatic echogenicity as can be seen with hepatic
steatosis.
IMPRESSION: 1. Nonobstructing 4 mm right renal calculus.
2. Otherwise normal bilateral kidneys.

## 2022-07-13 ENCOUNTER — Other Ambulatory Visit: Payer: Self-pay | Admitting: *Deleted

## 2022-07-13 DIAGNOSIS — Z1211 Encounter for screening for malignant neoplasm of colon: Secondary | ICD-10-CM

## 2022-07-17 ENCOUNTER — Ambulatory Visit: Payer: 59 | Admitting: General Surgery

## 2022-07-17 ENCOUNTER — Other Ambulatory Visit: Payer: Self-pay

## 2022-07-17 ENCOUNTER — Encounter: Payer: Self-pay | Admitting: General Surgery

## 2022-07-17 VITALS — BP 121/71 | HR 51 | Temp 98.2°F | Resp 18 | Ht 69.0 in | Wt 202.0 lb

## 2022-07-17 DIAGNOSIS — Z1211 Encounter for screening for malignant neoplasm of colon: Secondary | ICD-10-CM

## 2022-07-17 DIAGNOSIS — Z800 Family history of malignant neoplasm of digestive organs: Secondary | ICD-10-CM | POA: Diagnosis not present

## 2022-07-17 MED ORDER — SUTAB 1479-225-188 MG PO TABS: 24.0000 | ORAL_TABLET | Freq: Once | ORAL | 0 refills | Status: AC

## 2022-07-17 NOTE — Progress Notes (Signed)
[name]; [id number]; 1960-06-17   HPI Patient is a 62 year old white male who was referred to my care by Dr. Nita Sells for a screening colonoscopy due to a family history of colon carcinoma.  Patient last had a colonoscopy 5 years ago and it was negative.  His mother had colon cancer.  He denies any abnormal weight loss, diarrhea, constipation, blood per rectum, or abdominal pain. Past Medical History:  Diagnosis Date   Chronic back pain    Degenerative joint disease    GERD (gastroesophageal reflux disease)    Hypertension    PONV (postoperative nausea and vomiting)    Seasonal allergies     Past Surgical History:  Procedure Laterality Date   ANAL FISTULOTOMY N/A 10/29/2014   Procedure: ANAL FISTULOTOMY;  Surgeon: Franky Macho, MD;  Location: [facility];  Service: General;  Laterality: N/A;   COLONOSCOPY N/A 01/06/2013   Procedure: COLONOSCOPY;  Surgeon: Dalia Heading, MD;  Location: [facility];  Service: Gastroenterology;  Laterality: N/A;   COLONOSCOPY N/A 11/12/2017   Procedure: COLONOSCOPY;  Surgeon: Franky Macho, MD;  Location: [facility];  Service: Gastroenterology;  Laterality: N/A;   INCISION AND DRAINAGE ABSCESS N/A 09/18/2013   Procedure: INCISION AND DRAINAGE PERI-RECTAL ABSCESS;  Surgeon: Dalia Heading, MD;  Location: [facility];  Service: General;  Laterality: N/A;   KNEE ARTHROSCOPY Bilateral    PLANTAR FASCIA SURGERY Right    REPAIR TENDONS FOOT     SHOULDER ARTHROSCOPY Bilateral    WRIST ARTHROSCOPY Right    torn ligament    Family History  Problem Relation Age of Onset   Hypertension Mother    Colon cancer Mother    Cancer Father     Current Outpatient Medications on File Prior to Visit  Medication Sig Dispense Refill   amLODipine (NORVASC) 2.5 MG tablet Take 2.5 mg by mouth daily.     atorvastatin (LIPITOR) 10 MG tablet Take 10 mg by mouth daily.     chlorthalidone (HYGROTON) 25 MG tablet Take 25 mg by mouth daily.     diclofenac (VOLTAREN) 75 MG  EC tablet Take 75 mg by mouth 2 (two) times daily.   1   EPINEPHrine 0.3 mg/0.3 mL IJ SOAJ injection Inject 0.3 mg into the muscle daily as needed (for anaphylactic reactions.).      fluticasone (FLONASE) 50 MCG/ACT nasal spray Place 2 sprays into both nostrils daily.     gabapentin (NEURONTIN) 300 MG capsule Take 600 mg by mouth at bedtime.     loratadine (CLARITIN) 10 MG tablet Take 10 mg by mouth daily.     Nebivolol HCl (BYSTOLIC) 20 MG TABS Take 20 mg by mouth daily.      omeprazole (PRILOSEC) 20 MG capsule Take 20 mg by mouth daily as needed (heartburn/indigestion.).     No current facility-administered medications on file prior to visit.    Allergies  Allergen Reactions   Citrullus Vulgaris Anaphylaxis, Other (See Comments) and Swelling    watermelon  Other Reaction(s): Angioedema (ALLERGY/intolerance), Other (See Comments)    watermelon   Cucumber Extract Anaphylaxis   Other Anaphylaxis    cantaloupe    Social History   Substance and Sexual Activity  Alcohol Use Yes   Comment: seldom    Social History   Tobacco Use  Smoking Status Never   Passive exposure: Never  Smokeless Tobacco Never    Review of Systems  Constitutional: Negative.   HENT: Negative.    Eyes: Negative.  Respiratory: Negative.    Cardiovascular: Negative.   Gastrointestinal:  Positive for heartburn.  Genitourinary: Negative.   Musculoskeletal: Negative.   Skin: Negative.   Neurological: Negative.   Endo/Heme/Allergies: Negative.   Psychiatric/Behavioral: Negative.      Objective   Vitals:   07/17/22 1115  BP: 121/71  Pulse: (!) 51  Resp: 18  Temp: 98.2 F (36.8 C)  SpO2: 95%    Physical Exam Vitals reviewed.  Constitutional:      Appearance: Normal appearance. He is normal weight. He is not ill-appearing.  HENT:     Head: Normocephalic and atraumatic.  Cardiovascular:     Rate and Rhythm: Normal rate and regular rhythm.     Heart sounds: Normal heart sounds. No murmur  heard.    No friction rub. No gallop.  Pulmonary:     Effort: Pulmonary effort is normal. No respiratory distress.     Breath sounds: Normal breath sounds. No stridor. No wheezing, rhonchi or rales.  Abdominal:     General: Bowel sounds are normal. There is no distension.     Palpations: Abdomen is soft. There is no mass.     Tenderness: There is no abdominal tenderness. There is no guarding.     Hernia: No hernia is present.  Skin:    General: Skin is warm and dry.  Neurological:     Mental Status: He is alert and oriented to person, place, and time.   Previous colonoscopy report reviewed  Assessment  Need for screening colonoscopy, immediate family history of colon cancer Plan  Patient is scheduled for a colonoscopy on 08/28/2022.  The risks and benefits of the procedure including bleeding and perforation were fully explained to the patient, who gave informed consent.  Sutabs have been prescribed for bowel preparation.

## 2022-08-10 NOTE — H&P (Signed)
[name]; [id number]; 1960-06-17   HPI Patient is a 62 year old white male who was referred to my care by Dr. Nita Sells for a screening colonoscopy due to a family history of colon carcinoma.  Patient last had a colonoscopy 5 years ago and it was negative.  His mother had colon cancer.  He denies any abnormal weight loss, diarrhea, constipation, blood per rectum, or abdominal pain. Past Medical History:  Diagnosis Date   Chronic back pain    Degenerative joint disease    GERD (gastroesophageal reflux disease)    Hypertension    PONV (postoperative nausea and vomiting)    Seasonal allergies     Past Surgical History:  Procedure Laterality Date   ANAL FISTULOTOMY N/A 10/29/2014   Procedure: ANAL FISTULOTOMY;  Surgeon: Franky Macho, MD;  Location: [facility];  Service: General;  Laterality: N/A;   COLONOSCOPY N/A 01/06/2013   Procedure: COLONOSCOPY;  Surgeon: Dalia Heading, MD;  Location: [facility];  Service: Gastroenterology;  Laterality: N/A;   COLONOSCOPY N/A 11/12/2017   Procedure: COLONOSCOPY;  Surgeon: Franky Macho, MD;  Location: [facility];  Service: Gastroenterology;  Laterality: N/A;   INCISION AND DRAINAGE ABSCESS N/A 09/18/2013   Procedure: INCISION AND DRAINAGE PERI-RECTAL ABSCESS;  Surgeon: Dalia Heading, MD;  Location: [facility];  Service: General;  Laterality: N/A;   KNEE ARTHROSCOPY Bilateral    PLANTAR FASCIA SURGERY Right    REPAIR TENDONS FOOT     SHOULDER ARTHROSCOPY Bilateral    WRIST ARTHROSCOPY Right    torn ligament    Family History  Problem Relation Age of Onset   Hypertension Mother    Colon cancer Mother    Cancer Father     Current Outpatient Medications on File Prior to Visit  Medication Sig Dispense Refill   amLODipine (NORVASC) 2.5 MG tablet Take 2.5 mg by mouth daily.     atorvastatin (LIPITOR) 10 MG tablet Take 10 mg by mouth daily.     chlorthalidone (HYGROTON) 25 MG tablet Take 25 mg by mouth daily.     diclofenac (VOLTAREN) 75 MG  EC tablet Take 75 mg by mouth 2 (two) times daily.   1   EPINEPHrine 0.3 mg/0.3 mL IJ SOAJ injection Inject 0.3 mg into the muscle daily as needed (for anaphylactic reactions.).      fluticasone (FLONASE) 50 MCG/ACT nasal spray Place 2 sprays into both nostrils daily.     gabapentin (NEURONTIN) 300 MG capsule Take 600 mg by mouth at bedtime.     loratadine (CLARITIN) 10 MG tablet Take 10 mg by mouth daily.     Nebivolol HCl (BYSTOLIC) 20 MG TABS Take 20 mg by mouth daily.      omeprazole (PRILOSEC) 20 MG capsule Take 20 mg by mouth daily as needed (heartburn/indigestion.).     No current facility-administered medications on file prior to visit.    Allergies  Allergen Reactions   Citrullus Vulgaris Anaphylaxis, Other (See Comments) and Swelling    watermelon  Other Reaction(s): Angioedema (ALLERGY/intolerance), Other (See Comments)    watermelon   Cucumber Extract Anaphylaxis   Other Anaphylaxis    cantaloupe    Social History   Substance and Sexual Activity  Alcohol Use Yes   Comment: seldom    Social History   Tobacco Use  Smoking Status Never   Passive exposure: Never  Smokeless Tobacco Never    Review of Systems  Constitutional: Negative.   HENT: Negative.    Eyes: Negative.  Respiratory: Negative.    Cardiovascular: Negative.   Gastrointestinal:  Positive for heartburn.  Genitourinary: Negative.   Musculoskeletal: Negative.   Skin: Negative.   Neurological: Negative.   Endo/Heme/Allergies: Negative.   Psychiatric/Behavioral: Negative.      Objective   Vitals:   07/17/22 1115  BP: 121/71  Pulse: (!) 51  Resp: 18  Temp: 98.2 F (36.8 C)  SpO2: 95%    Physical Exam Vitals reviewed.  Constitutional:      Appearance: Normal appearance. He is normal weight. He is not ill-appearing.  HENT:     Head: Normocephalic and atraumatic.  Cardiovascular:     Rate and Rhythm: Normal rate and regular rhythm.     Heart sounds: Normal heart sounds. No murmur  heard.    No friction rub. No gallop.  Pulmonary:     Effort: Pulmonary effort is normal. No respiratory distress.     Breath sounds: Normal breath sounds. No stridor. No wheezing, rhonchi or rales.  Abdominal:     General: Bowel sounds are normal. There is no distension.     Palpations: Abdomen is soft. There is no mass.     Tenderness: There is no abdominal tenderness. There is no guarding.     Hernia: No hernia is present.  Skin:    General: Skin is warm and dry.  Neurological:     Mental Status: He is alert and oriented to person, place, and time.   Previous colonoscopy report reviewed  Assessment  Need for screening colonoscopy, immediate family history of colon cancer Plan  Patient is scheduled for a colonoscopy on 08/28/2022.  The risks and benefits of the procedure including bleeding and perforation were fully explained to the patient, who gave informed consent.  Sutabs have been prescribed for bowel preparation.

## 2022-08-28 ENCOUNTER — Encounter (HOSPITAL_COMMUNITY)
Admission: RE | Disposition: A | Discharge status: Home / Self Care | Payer: Self-pay | Source: Home / Self Care | Attending: General Surgery

## 2022-08-28 ENCOUNTER — Other Ambulatory Visit: Payer: Self-pay

## 2022-08-28 ENCOUNTER — Encounter (HOSPITAL_COMMUNITY): Payer: Self-pay | Admitting: General Surgery

## 2022-08-28 ENCOUNTER — Ambulatory Visit (HOSPITAL_COMMUNITY): Payer: No Typology Code available for payment source | Admitting: Anesthesiology

## 2022-08-28 ENCOUNTER — Ambulatory Visit (HOSPITAL_COMMUNITY)
Admission: RE | Admit: 2022-08-28 | Discharge: 2022-08-28 | Disposition: A | Discharge status: Home / Self Care | Payer: No Typology Code available for payment source | Attending: General Surgery | Admitting: General Surgery

## 2022-08-28 ENCOUNTER — Ambulatory Visit (HOSPITAL_BASED_OUTPATIENT_CLINIC_OR_DEPARTMENT_OTHER): Payer: No Typology Code available for payment source | Admitting: Anesthesiology

## 2022-08-28 DIAGNOSIS — Z1211 Encounter for screening for malignant neoplasm of colon: Secondary | ICD-10-CM

## 2022-08-28 DIAGNOSIS — Z800 Family history of malignant neoplasm of digestive organs: Secondary | ICD-10-CM

## 2022-08-28 HISTORY — PX: COLONOSCOPY WITH PROPOFOL: SHX5780

## 2022-08-28 SURGERY — COLONOSCOPY WITH PROPOFOL
Anesthesia: General

## 2022-08-28 MED ORDER — PROPOFOL 10 MG/ML IV BOLUS
INTRAVENOUS | Status: DC | PRN
  Administered 2022-08-28: 100 mg via INTRAVENOUS

## 2022-08-28 MED ORDER — LACTATED RINGERS IV SOLN
INTRAVENOUS | Status: DC
  Administered 2022-08-28: 1000 mL via INTRAVENOUS

## 2022-08-28 MED ORDER — PROPOFOL 500 MG/50ML IV EMUL
INTRAVENOUS | Status: DC | PRN
  Administered 2022-08-28: 150 ug/kg/min via INTRAVENOUS

## 2022-08-28 NOTE — Interval H&P Note (Signed)
History and Physical Interval Note:  08/28/2022 7:26 AM  [name]  has presented today for surgery, with the diagnosis of SCREENING FOR COLON CANCER FAMILY HX COLON CANCER.  The various methods of treatment have been discussed with the patient and family. After consideration of risks, benefits and other options for treatment, the patient has consented to  Procedure(s): COLONOSCOPY WITH PROPOFOL (N/A) as a surgical intervention.  The patient's history has been reviewed, patient examined, no change in status, stable for surgery.  I have reviewed the patient's chart and labs.  Questions were answered to the patient's satisfaction.     Franky Macho

## 2022-08-28 NOTE — Op Note (Signed)
[facility] Patient Name: [name] Procedure Date: 08/28/2022 7:11 AM MRN: [id number] Date of Birth: 1961-01-11 Attending MD: Franky Macho , MD, [id number] CSN: [id number] Age: 62 Admit Type: Outpatient Procedure:                Colonoscopy Indications:              Screening in patient at increased risk: Family                            history of 1st-degree relative with colorectal                            cancer Providers:                Franky Macho, MD, Angelica Ran, Kristine L. Jessee Avers, Technician Referring MD:              Medicines:                Propofol per Anesthesia, Monitored Anesthesia Care Complications:            No immediate complications. Estimated Blood Loss:     Estimated blood loss: none. Procedure:                Pre-Anesthesia Assessment:                           - Prior to the procedure, a History and Physical                            was performed, and patient medications and                            allergies were reviewed. The patient is competent.                            The risks and benefits of the procedure and the                            sedation options and risks were discussed with the                            patient. All questions were answered and informed                            consent was obtained. Patient identification and                            proposed procedure were verified by the physician,                            the nurse, the anesthetist and the technician in  the procedure room. Mental Status Examination:                            alert and oriented. Airway Examination: normal                            oropharyngeal airway and neck mobility. Respiratory                            Examination: clear to auscultation. CV Examination:                            RRR, no murmurs, no S3 or S4. Prophylactic                            Antibiotics:  The patient does not require                            prophylactic antibiotics. Prior Anticoagulants: The                            patient has taken no anticoagulant or antiplatelet                            agents. ASA Grade Assessment: II - A patient with                            mild systemic disease. After reviewing the risks                            and benefits, the patient was deemed in                            satisfactory condition to undergo the procedure.                            The anesthesia plan was to use deep sedation /                            analgesia. Immediately prior to administration of                            medications, the patient was re-assessed for                            adequacy to receive sedatives. The heart rate,                            respiratory rate, oxygen saturations, blood                            pressure, adequacy of pulmonary ventilation, and  response to care were monitored throughout the                            procedure. The physical status of the patient was                            re-assessed after the procedure.                           After obtaining informed consent, the colonoscope                            was passed under direct vision. Throughout the                            procedure, the patient's blood pressure, pulse, and                            oxygen saturations were monitored continuously. The                            [phone]) scope was introduced through the                            anus and advanced to the the cecum, identified by                            the appendiceal orifice, ileocecal valve and                            palpation. No anatomical landmarks were                            photographed. The entire colon was well visualized.                            The colonoscopy was performed without difficulty.                             The patient tolerated the procedure well. The                            quality of the bowel preparation was adequate. The                            total duration of the procedure was 9 minutes. Scope In: 7:30:04 AM Scope Out: 7:39:09 AM Scope Withdrawal Time: 0 hours 4 minutes 13 seconds  Total Procedure Duration: 0 hours 9 minutes 5 seconds  Findings:      The perianal and digital rectal examinations were normal.      The entire examined colon appeared normal on direct and retroflexion       views. Impression:               - The entire examined colon is normal on direct  and                            retroflexion views.                           - No specimens collected. Moderate Sedation:      Moderate (conscious) sedation was administered by the nurse and       supervised by the endoscopist. The patient's oxygen saturation, heart       rate, blood pressure and response to care were monitored. Recommendation:           - Written discharge instructions were provided to                            the patient.                           - The signs and symptoms of potential delayed                            complications were discussed with the patient.                           - Patient has a contact number available for                            emergencies.                           - Return to normal activities tomorrow.                           - Resume previous diet.                           - Continue present medications.                           - Repeat colonoscopy in 5 years for screening                            purposes. Procedure Code(s):        --- Professional ---                           [phone], Colonoscopy, flexible; diagnostic, including                            collection of specimen(s) by brushing or washing,                            when performed (separate procedure) Diagnosis Code(s):        --- Professional ---                           Z80.0,  Family history of malignant neoplasm of  digestive organs CPT copyright 2022 [employer]. All rights reserved. The codes documented in this report are preliminary and upon coder review may  be revised to meet current compliance requirements. Franky Macho, MD Franky Macho, MD 08/28/2022 7:42:45 AM This report has been signed electronically. Number of Addenda: 0

## 2022-08-28 NOTE — Transfer of Care (Signed)
Immediate Anesthesia Transfer of Care Note  Patient: [name]  Procedure(s) Performed: COLONOSCOPY WITH PROPOFOL  Patient Location: Endoscopy Unit  Anesthesia Type:General  Level of Consciousness: awake and alert   Airway & Oxygen Therapy: Patient Spontanous Breathing  Post-op Assessment: Report given to RN and Post -op Vital signs reviewed and stable  Post vital signs: Reviewed and stable  Last Vitals:  Vitals Value Taken Time  BP 93/55   Temp 36.6 C 08/28/22 0741  Pulse 57 08/28/22 0741  Resp 19 08/28/22 0741  SpO2 97 % 08/28/22 0741    Last Pain:  Vitals:   08/28/22 0741  TempSrc: Oral  PainSc: 0-No pain      Patients Stated Pain Goal: 9 (08/28/22 0707)  Complications: No notable events documented.

## 2022-08-28 NOTE — Anesthesia Postprocedure Evaluation (Signed)
Anesthesia Post Note  Patient: [name]  Procedure(s) Performed: COLONOSCOPY WITH PROPOFOL  Patient location during evaluation: Phase II Anesthesia Type: General Level of consciousness: awake and alert and oriented Pain management: pain level controlled Vital Signs Assessment: post-procedure vital signs reviewed and stable Respiratory status: spontaneous breathing, nonlabored ventilation and respiratory function stable Cardiovascular status: blood pressure returned to baseline and stable Postop Assessment: no apparent nausea or vomiting Anesthetic complications: no  No notable events documented.   Last Vitals:  Vitals:   08/28/22 0741 08/28/22 0747  BP: (!) 93/55 103/62  Pulse: (!) 57 60  Resp: 19 16  Temp: 36.6 C   SpO2: 97% 98%    Last Pain:  Vitals:   08/28/22 0741  TempSrc: Oral  PainSc: 0-No pain                 Rossi Burdo C Jlen Wintle

## 2022-08-28 NOTE — Anesthesia Preprocedure Evaluation (Addendum)
Anesthesia Evaluation  Patient identified by MRN, date of birth, ID band Patient awake    Reviewed: Allergy & Precautions, NPO status , Patient's Chart, lab work & pertinent test results  History of Anesthesia Complications (+) PONV and history of anesthetic complications  Airway Mallampati: I  TM Distance: >3 FB Neck ROM: Full    Dental  (+) Dental Advisory Given   Pulmonary neg pulmonary ROS   Pulmonary exam normal        Cardiovascular Exercise Tolerance: Good hypertension, Pt. on medications  Rhythm:Regular Rate:Bradycardia     Neuro/Psych negative neurological ROS     GI/Hepatic Neg liver ROS,GERD  Medicated,,  Endo/Other  negative endocrine ROS    Renal/GU negative Renal ROS     Musculoskeletal   Abdominal Normal abdominal exam  (+)   Peds  Hematology   Anesthesia Other Findings   Reproductive/Obstetrics                              Anesthesia Physical Anesthesia Plan  ASA: 2  Anesthesia Plan: General   Post-op Pain Management:    Induction: Intravenous  PONV Risk Score and Plan: Propofol infusion  Airway Management Planned: Nasal Cannula and Natural Airway  Additional Equipment:   Intra-op Plan:   Post-operative Plan:   Informed Consent: I have reviewed the patients History and Physical, chart, labs and discussed the procedure including the risks, benefits and alternatives for the proposed anesthesia with the patient or authorized representative who has indicated his/her understanding and acceptance.     Dental advisory given  Plan Discussed with: Anesthesiologist  Anesthesia Plan Comments:         Anesthesia Quick Evaluation

## 2022-08-29 ENCOUNTER — Ambulatory Visit: Payer: No Typology Code available for payment source | Admitting: Podiatry

## 2022-08-29 ENCOUNTER — Encounter: Payer: Self-pay | Admitting: Podiatry

## 2022-08-29 DIAGNOSIS — L600 Ingrowing nail: Secondary | ICD-10-CM

## 2022-08-29 NOTE — Progress Notes (Signed)
Subjective:   Patient ID: [name], male   DOB: 62 y.o.   MRN: [id number]   HPI Patient presents with ingrown toenail of the left big toe states it has been very sore and making shoe gear difficult.  Has tried to trim it himself does have some thickness of nails.  Does not smoke likes to be active   Review of Systems  All other systems reviewed and are negative.       Objective:  Physical Exam Vitals and nursing note reviewed.  Constitutional:      Appearance: He is well-developed.  Pulmonary:     Effort: Pulmonary effort is normal.  Musculoskeletal:        General: Normal range of motion.  Skin:    General: Skin is warm.  Neurological:     Mental Status: He is alert.     Neurovascular status intact muscle strength adequate range of motion adequate with incurvated left big toenail lateral border painful when pressed inability to wear shoe gear comfortably localized redness no active drainage proximal edema edema drainage noted.  Good digital perfusion well-oriented     Assessment:  Ingrown toenail chronic left big toe with nail structural issues     Plan:  H&P reviewed correction and discussed what would be required.  Patient wants surgery and I allowed him to sign consent form after reviewing today I infiltrated the left big toe 60 mg like Marcaine mixture sterile prep done using sterile instrumentation remove the lateral border exposed matrix applied phenol 3 applications 30 seconds followed by alcohol lavage sterile dressing gave instructions on soaks wear dressing 24 hours take it off earlier if throbbing were to occur and answered all questions for him and encouraged him to call if any issues should occur postoperatively

## 2022-08-29 NOTE — Patient Instructions (Signed)

## 2022-09-10 ENCOUNTER — Ambulatory Visit: Payer: No Typology Code available for payment source | Admitting: Neurology

## 2022-09-10 ENCOUNTER — Encounter: Payer: Self-pay | Admitting: Neurology

## 2022-09-10 VITALS — BP 149/84 | HR 63 | Ht 69.0 in | Wt 200.0 lb

## 2022-09-10 DIAGNOSIS — R252 Cramp and spasm: Secondary | ICD-10-CM

## 2022-09-10 DIAGNOSIS — M545 Low back pain, unspecified: Secondary | ICD-10-CM | POA: Diagnosis not present

## 2022-09-10 NOTE — Progress Notes (Signed)
Chief Complaint  Patient presents with   New Patient (Initial Visit)    Rm14, alone  Bilateral cramp of muscle of lower limbs: pt mentioned hands gets cramps as well but predominantly in lower legs and the popliteal area. Feels like a charlie horse pain is constant, worse at times.       ASSESSMENT AND PLAN  [name] is a 62 y.o. male   Low extremity muscle cramps  Long history of slow worsening bilateral lower extremity muscle cramping  Known history of lumbar degenerative disease, not a surgical candidate, under the care of pain management receiving epidural injection.    Higher dose of gabapentin 300 mg up to 4 tablets every night  EMG nerve conduction study for evaluation of possible lumbar radiculopathy  MRI of cervical spine to rule out cervical spondylitic disease  Normal laboratory evaluation recently, bring result to review   DIAGNOSTIC DATA (LABS, IMAGING, TESTING) - I reviewed patient records, labs, notes, testing and imaging myself where available.   MEDICAL HISTORY:  [name] is a 62 year old male, seen in request by his primary care physician Dr. Margo Aye, Kathleene Hazel, for slow worsening lower extremity muscle cramps, initial evaluation was on September 10, 2022  I reviewed and summarized the referring note.  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, he noticed gradual worsening bilateral lower extremity muscle cramping, initially only intermittently, now 5 days out of the week he will be waking up very painful large muscle cramping involving bilateral lower extremity, usually adductor muscles, sometimes calf muscles, posterior thigh muscles, very painful, he has tried multiple different over-the-counter method, mustard, pickle juice, cream without helping, muscle relaxants such as methocarbamol, NSAIDs, was started on  gabapentin 300 mg 2 tablets every night for a while, it was helpful but not loses benefit  He has chronic low back pain, denies significant radiating pain, having MRI of lumbar pending, receiving epidural injection every few months,  PHYSICAL EXAM:   Vitals:   09/10/22 1546 09/10/22 1548  BP: (!) 154/80 (!) 149/84  Pulse: 63   Weight: 200 lb (90.7 kg)   Height: 5\' 9"  (1.753 m)    Not recorded     Body mass index is 29.53 kg/m.  PHYSICAL EXAMNIATION:  Gen: NAD, conversant, well nourised, well groomed                     Cardiovascular: Regular rate rhythm, no peripheral edema, warm, nontender. Eyes: Conjunctivae clear without exudates or hemorrhage Neck: Supple, no carotid bruits. Pulmonary: Clear to auscultation bilaterally   NEUROLOGICAL EXAM:  MENTAL STATUS: Speech/cognition: Awake, alert, oriented to history taking and casual conversation CRANIAL NERVES: CN II: Visual fields are full to confrontation. Pupils are round equal and briskly reactive to light. CN III, IV, VI: extraocular movement are normal. No ptosis. CN V: Facial sensation is intact to light touch CN VII: Face is symmetric with normal eye closure  CN VIII: Hearing is normal to causal conversation. CN IX, X: Phonation is normal. CN XI: Head turning and shoulder shrug are intact  MOTOR: There is no pronator drift of out-stretched arms. Muscle bulk and tone are normal. Muscle strength is normal.  REFLEXES: Reflexes are 1 and symmetric at the biceps, triceps, knees, and ankles. Plantar responses are flexor.  SENSORY: Intact to light touch, pinprick and vibratory sensation are intact in  fingers and toes.  COORDINATION: There is no trunk or limb dysmetria noted.  GAIT/STANCE: Posture is normal. Gait is steady with normal steps, base, arm swing, and turning. Heel and toe walking are normal. Tandem gait is normal.  Romberg is absent.  REVIEW OF SYSTEMS:  Full 14 system review of systems performed and  notable only for as above All other review of systems were negative.   ALLERGIES: Allergies  Allergen Reactions   Citrullus Vulgaris Anaphylaxis, Other (See Comments) and Swelling    watermelon  Other Reaction(s): Angioedema (ALLERGY/intolerance), Other (See Comments)    watermelon   Cucumber Extract Anaphylaxis   Other Anaphylaxis    cantaloupe    HOME MEDICATIONS: Current Outpatient Medications  Medication Sig Dispense Refill   amLODipine (NORVASC) 2.5 MG tablet Take 2.5 mg by mouth daily.     atorvastatin (LIPITOR) 10 MG tablet Take 10 mg by mouth daily.     chlorthalidone (HYGROTON) 25 MG tablet Take 25 mg by mouth daily.     diclofenac (VOLTAREN) 75 MG EC tablet Take 75 mg by mouth 2 (two) times daily.   1   EPINEPHrine 0.3 mg/0.3 mL IJ SOAJ injection Inject 0.3 mg into the muscle daily as needed (for anaphylactic reactions.).      fluticasone (FLONASE) 50 MCG/ACT nasal spray Place 2 sprays into both nostrils daily.     gabapentin (NEURONTIN) 300 MG capsule Take 600 mg by mouth at bedtime.     loratadine (CLARITIN) 10 MG tablet Take 10 mg by mouth daily.     Nebivolol HCl (BYSTOLIC) 20 MG TABS Take 20 mg by mouth daily.      omeprazole (PRILOSEC) 20 MG capsule Take 20 mg by mouth daily as needed (heartburn/indigestion.).     No current facility-administered medications for this visit.    PAST MEDICAL HISTORY: Past Medical History:  Diagnosis Date   Chronic back pain    Degenerative joint disease    GERD (gastroesophageal reflux disease)    Hypertension    PONV (postoperative nausea and vomiting)    Seasonal allergies     PAST SURGICAL HISTORY: Past Surgical History:  Procedure Laterality Date   ANAL FISTULOTOMY N/A 10/29/2014   Procedure: ANAL FISTULOTOMY;  Surgeon: Franky Macho, MD;  Location: [facility];  Service: General;  Laterality: N/A;   COLONOSCOPY N/A 01/06/2013   Procedure: COLONOSCOPY;  Surgeon: Dalia Heading, MD;  Location: [facility];  Service:  Gastroenterology;  Laterality: N/A;   COLONOSCOPY N/A 11/12/2017   Procedure: COLONOSCOPY;  Surgeon: Franky Macho, MD;  Location: [facility];  Service: Gastroenterology;  Laterality: N/A;   COLONOSCOPY WITH PROPOFOL N/A 08/28/2022   Procedure: COLONOSCOPY WITH PROPOFOL;  Surgeon: Franky Macho, MD;  Location: [facility];  Service: Gastroenterology;  Laterality: N/A;   INCISION AND DRAINAGE ABSCESS N/A 09/18/2013   Procedure: INCISION AND DRAINAGE PERI-RECTAL ABSCESS;  Surgeon: Dalia Heading, MD;  Location: [facility];  Service: General;  Laterality: N/A;   KNEE ARTHROSCOPY Bilateral    PLANTAR FASCIA SURGERY Right    REPAIR TENDONS FOOT     SHOULDER ARTHROSCOPY Bilateral    WRIST ARTHROSCOPY Right    torn ligament    FAMILY HISTORY: Family History  Problem Relation Age of Onset   Hypertension Mother    Colon cancer Mother    Cancer Father     SOCIAL HISTORY: Social History   Socioeconomic History   Marital status: Married    Spouse name: Not on file  Number of children: Not on file   Years of education: Not on file   Highest education level: Not on file  Occupational History   Not on file  Tobacco Use   Smoking status: Never    Passive exposure: Never   Smokeless tobacco: Never  Vaping Use   Vaping status: Never Used  Substance and Sexual Activity   Alcohol use: Yes    Comment: seldom   Drug use: No   Sexual activity: Yes    Birth control/protection: None  Other Topics Concern   Not on file  Social History Narrative   Not on file   Social Determinants of Health   Financial Resource Strain: Not on file  Food Insecurity: Not on file  Transportation Needs: Not on file  Physical Activity: Not on file  Stress: Not on file  Social Connections: Not on file  Intimate Partner Violence: Not on file      Levert Feinstein, M.D. Ph.D.  [facility] [address] [place], [place] [address] Ph: [phone] Fax: [phone]  CC:  Leone Payor, FNP [address] [place],  [place] [address]  Benita Stabile, MD

## 2022-09-26 ENCOUNTER — Telehealth: Payer: Self-pay | Admitting: Neurology

## 2022-09-26 NOTE — Telephone Encounter (Signed)
Pt states he was told Dr Terrace Arabia would have an order placed for a MRI and NCS/EMG.  Pt was also told Gabapentin would be called in because she wants him to begin an increased dose.  Pt has not been contacted about anything, please advise

## 2022-09-27 MED ORDER — GABAPENTIN 300 MG PO CAPS: 300.0000 mg | ORAL_CAPSULE | Freq: Four times a day (QID) | ORAL | 11 refills | Status: DC | PRN

## 2022-09-27 NOTE — Telephone Encounter (Signed)
MRI order sent to [facility] they obtain Drucie Opitz [phone]

## 2022-09-27 NOTE — Addendum Note (Signed)
Addended by: Levert Feinstein on: 09/27/2022 11:07 AM   Modules accepted: Orders

## 2022-09-27 NOTE — Telephone Encounter (Signed)
Please let patient know, I have ordered EMG/NCS, MRI, higher dose of gabapentin

## 2022-09-27 NOTE — Telephone Encounter (Signed)
Call to patient and advised that EMG/NCV and MRI was ordered and reviewed increased frequency in gabapentin. Patient verbalized understanding and appreciative of call.

## 2022-10-06 ENCOUNTER — Ambulatory Visit
Admission: RE | Admit: 2022-10-06 | Discharge: 2022-10-06 | Disposition: A | Discharge status: Home / Self Care | Payer: 59 | Source: Ambulatory Visit | Attending: Neurology | Admitting: Neurology

## 2022-10-06 DIAGNOSIS — R252 Cramp and spasm: Secondary | ICD-10-CM | POA: Diagnosis not present

## 2022-10-06 DIAGNOSIS — M545 Low back pain, unspecified: Secondary | ICD-10-CM | POA: Diagnosis not present

## 2022-11-28 ENCOUNTER — Ambulatory Visit: Payer: 59 | Admitting: Neurology

## 2022-11-28 ENCOUNTER — Ambulatory Visit (INDEPENDENT_AMBULATORY_CARE_PROVIDER_SITE_OTHER): Payer: 59 | Admitting: Neurology

## 2022-11-28 VITALS — BP 136/90 | HR 56 | Ht 69.0 in | Wt 200.0 lb

## 2022-11-28 DIAGNOSIS — R252 Cramp and spasm: Secondary | ICD-10-CM

## 2022-11-28 DIAGNOSIS — M545 Low back pain, unspecified: Secondary | ICD-10-CM | POA: Diagnosis not present

## 2022-11-28 DIAGNOSIS — G629 Polyneuropathy, unspecified: Secondary | ICD-10-CM | POA: Diagnosis not present

## 2022-11-28 MED ORDER — DULOXETINE HCL 60 MG PO CPEP: 60.0000 mg | ORAL_CAPSULE | Freq: Every day | ORAL | 11 refills | Status: DC

## 2022-11-28 NOTE — Procedures (Signed)
Full Name: [name] Gender: Male MRN #: [id number] Date of Birth: [birth year]    Visit Date: 11/28/2022 08:14 Age: 62 Years Examining Physician: Dr. Levert Feinstein Referring Physician: Dr. Levert Feinstein Height: 5 feet 9 inch History: 62 year old male complains of chronic low back pain, frequent lower extremity muscle cramping  Summary of the test:  Nerve conduction study:  Bilateral sural sensory responses were within normal limit with exception of mildly prolonged peak latency that can be related to his cold limb temperature.  Bilateral superficial peroneal sensory responses showed mild to moderately decreased snap amplitude, also with moderately prolonged peak latency.  Bilateral tibial motor responses showed moderate decreased CMAP amplitude, mildly decreased conduction velocity.  Bilateral peroneal to EDB motor responses were normal.  Right median sensory response showed moderately prolonged peak latency with mildly decreased snap amplitude.  Right ulnar sensory response showed mildly prolonged peak latency, normal snap amplitude.  Right sensory responses were within normal limit.  Right median motor response showed mild to moderately prolonged distal latency, normal CMAP amplitude,  Right ulnar motor responses showed slight prolonged distal latency, otherwise were within normal limit  Bilateral tibial, and right ulnar F-wave latency was mildly prolonged  Electromyography: Selected needle examinations of bilateral lower extremity muscles and lumbosacral paraspinal muscle were performed.  There is evidence of active denervation, chronic neuropathic changes involving bilateral abductor hallucis longus.  There was no spontaneous activity at bilateral lumbosacral paraspinal muscles.  Conclusion: This is an abnormal study.  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  There is no evidence of active bilateral  lumbosacral radiculopathy.  There is also evidence of moderate right carpal tunnel syndrome.      ------------------------------- Levert Feinstein, M.D. PhD  [facility] [address], [address] [place], [place] [address] Tel: [phone] Fax: [phone]  Verbal informed consent was obtained from the patient, patient was informed of potential risk of procedure, including bruising, bleeding, hematoma formation, infection, muscle weakness, muscle pain, numbness, among others.        MNC    Nerve / Sites Muscle Latency Ref. Amplitude Ref. Rel Amp Segments Distance Velocity Ref. Area    ms ms mV mV %  cm m/s m/s mVms  R Median - APB     Wrist APB 5.2 <=4.4 9.6 >=4.0 100 Wrist - APB 7   37.1     Upper arm APB 10.9  9.5  99.2 Upper arm - Wrist 23.6 41 >=49 36.9  R Ulnar - ADM     Wrist ADM 3.6 <=3.3 11.4 >=6.0 100 Wrist - ADM 7   41.8     B.Elbow ADM 5.9  10.7  93.7 B.Elbow - Wrist 11.6 51 >=49 41.4     A.Elbow ADM 9.8  9.9  92.8 A.Elbow - B.Elbow 19 49 >=49 39.8  R Peroneal - EDB     Ankle EDB 5.9 <=6.5 2.2 >=2.0 100 Ankle - EDB 9   6.6     Fib head EDB 12.6  1.6  73.2 Fib head - Ankle 27.6 41 >=44 4.8     Pop fossa EDB 15.6  1.5  94.5 Pop fossa - Fib head 12.6 42 >=44 5.0         Pop fossa - Ankle      L Peroneal - EDB     Ankle EDB 5.2 <=6.5 2.9 >=2.0 100 Ankle - EDB 9   8.8  Fib head EDB 12.1  2.4  83.2 Fib head - Ankle 29 42 >=44 7.6     Pop fossa EDB 15.1  3.1  129 Pop fossa - Fib head 12 41 >=44 10.4         Pop fossa - Ankle      R Tibial - AH     Ankle AH 4.8 <=5.8 1.3 >=4.0 100 Ankle - AH 9   2.9     Pop fossa AH 16.3  0.8  62.5 Pop fossa - Ankle 40.6 35 >=41 2.2  L Tibial - AH     Ankle AH 4.1 <=5.8 1.8 >=4.0 100 Ankle - AH 9   4.1     Pop fossa AH 14.5  1.2  66.4 Pop fossa - Ankle 40.6 39 >=41 9.4                 SNC    Nerve / Sites Rec. Site Peak Lat Ref.  Amp Ref. Segments Distance    ms ms V V  cm  R Radial - Anatomical snuff box (Forearm)      Forearm Wrist 2.9 <=2.9 24 >=15 Forearm - Wrist 10  R Sural - Ankle (Calf)     Calf Ankle 4.7 <=4.4 10 >=6 Calf - Ankle 14  L Sural - Ankle (Calf)     Calf Ankle 4.6 <=4.4 6 >=6 Calf - Ankle 14  R Superficial peroneal - Ankle     Lat leg Ankle 5.1 <=4.4 3 >=6 Lat leg - Ankle 14  L Superficial peroneal - Ankle     Lat leg Ankle 4.5 <=4.4 5 >=6 Lat leg - Ankle 14  R Median - Orthodromic (Dig II, Mid palm)     Dig II Wrist 5.0 <=3.4 6 >=10 Dig II - Wrist 13  R Ulnar - Orthodromic, (Dig V, Mid palm)     Dig V Wrist 3.8 <=3.1 9 >=5 Dig V - Wrist 53                   F  Wave    Nerve F Lat Ref.   ms ms  R Tibial - AH 60.7 <=56.0  L Tibial - AH 61.7 <=56.0  R Ulnar - ADM 33.5 <=32.0           EMG Summary Table    Spontaneous MUAP Recruitment  Muscle IA Fib PSW Fasc Other Amp Dur. Poly Pattern  R. Tibialis anterior Normal None None None _______ Normal Normal Normal Normal  R. Tibialis posterior Normal None None None _______ Normal Normal Normal Normal  R. Peroneus longus Normal None None None _______ Normal Normal Normal Normal  R. Gastrocnemius (Medial head) Normal None None None _______ Normal Normal Normal Normal  R. Vastus lateralis Normal None None None _______ Normal Normal Normal Normal  R. Biceps femoris (long head) Normal None None None _______ Normal Normal Normal Normal  R. Biceps femoris (short head) Normal None None None _______ Normal Normal Normal Normal  R. Gluteus medius Normal None None None _______ Normal Normal Normal Normal  R. Lumbar paraspinals (low) Normal None None None _______ Normal Normal Normal Normal  R. Lumbar paraspinals (mid) Normal None None None _______ Normal Normal Normal Normal  L. Tibialis anterior Normal None None None _______ Normal Normal Normal Normal  L. Tibialis posterior Normal None None None _______ Normal Normal Normal Normal  L. Peroneus longus Normal None None None _______ Normal Normal Normal Normal  L. Gastrocnemius (  Medial head)  Normal None None None _______ Normal Normal Normal Normal  L. Vastus lateralis Normal None None None _______ Normal Normal Normal Normal  L. Lumbar paraspinals (low) Normal None None None _______ Normal Normal Normal Normal  L. Lumbar paraspinals (mid) Normal None None None _______ Normal Normal Normal Normal  R. Abductor hallucis Increased 1+ None None _______ Increased Increased 1+ Reduced  L. Abductor hallucis Increased None None None _______ Increased Increased 1+ Reduced

## 2022-11-28 NOTE — Progress Notes (Signed)
ASSESSMENT AND PLAN  [name] is a 62 y.o. male   Low extremity muscle cramps  EMG nerve conduction study confirmed peripheral neuropathy, evidence of distal axonal loss,  Known history of lumbar degenerative disease, not a surgical candidate, under the care of pain management receiving epidural injection.    Higher dose of gabapentin 300 mg up to 4 tablets every night has been helpful, add on Cymbalta 60 mg daily  Laboratory evaluation to rule out treatable cause for peripheral neuropathy   DIAGNOSTIC DATA (LABS, IMAGING, TESTING) - I reviewed patient records, labs, notes, testing and imaging myself where available.   MEDICAL HISTORY:  [name] is a 62 year old male, seen in request by his primary care physician Dr. Margo Aye, Kathleene Hazel, for slow worsening lower extremity muscle cramps, initial evaluation was on September 10, 2022  I reviewed and summarized the referring note.  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, he noticed gradual worsening bilateral lower extremity muscle cramping, initially only intermittently, now 5 days out of the week he will be waking up very painful large muscle cramping involving bilateral lower extremity, usually adductor muscles, sometimes calf muscles, posterior thigh muscles, very painful, he has tried multiple different over-the-counter method, mustard, pickle juice, cream without helping, muscle relaxants such as methocarbamol, NSAIDs, was started on gabapentin 300 mg 2 tablets every night for a while, it was helpful but not loses benefit  He has chronic low back pain, denies significant radiating pain, having MRI of lumbar pending, receiving epidural injection every few months,  UPDATE Nov 28 2022: Electrodiagnostic study today demonstrate evidence of sensorimotor polyneuropathy, with evidence of distal  axonal loss, there is also evidence of moderate right carpal tunnel syndromes.  There is no evidence of active bilateral lumbosacral radiculopathy.  He has no gait abnormality, higher dose of gabapentin did help his muscle spasm,  Lab from primary care reviewed normal CBC, CMP with exception of mild elevated glucose 105 PHYSICAL EXAM:  136/90, HR 56. PHYSICAL EXAMNIATION:  Gen: NAD, conversant, well nourised, well groomed                     Cardiovascular: Regular rate rhythm, no peripheral edema, warm, nontender. Eyes: Conjunctivae clear without exudates or hemorrhage Neck: Supple, no carotid bruits. Pulmonary: Clear to auscultation bilaterally   NEUROLOGICAL EXAM:  MENTAL STATUS: Speech/cognition: Awake, alert, oriented to history taking and casual conversation CRANIAL NERVES: CN II: Visual fields are full to confrontation. Pupils are round equal and briskly reactive to light. CN III, IV, VI: extraocular movement are normal. No ptosis. CN V: Facial sensation is intact to light touch CN VII: Face is symmetric with normal eye closure  CN VIII: Hearing is normal to causal conversation. CN IX, X: Phonation is normal. CN XI: Head turning and shoulder shrug are intact  MOTOR: There is no pronator drift of out-stretched arms. Muscle bulk and tone are normal. Muscle strength is normal.  REFLEXES: Reflexes are 1 and symmetric at the biceps, triceps, knees, and absent at ankles. Plantar responses are flexor.  SENSORY: Intact to light touch, pinprick and vibratory sensation are intact in fingers and toes.  COORDINATION: There is no trunk or limb dysmetria noted.  GAIT/STANCE: Posture is normal. Gait is steady   REVIEW OF SYSTEMS:  Full 14 system review of systems performed and notable only for as  above All other review of systems were negative.   ALLERGIES: Allergies  Allergen Reactions   Citrullus Vulgaris Anaphylaxis, Other (See Comments) and Swelling     watermelon  Other Reaction(s): Angioedema (ALLERGY/intolerance), Other (See Comments)    watermelon   Cucumber Extract Anaphylaxis   Other Anaphylaxis    cantaloupe    HOME MEDICATIONS: Current Outpatient Medications  Medication Sig Dispense Refill   amLODipine (NORVASC) 2.5 MG tablet Take 2.5 mg by mouth daily.     atorvastatin (LIPITOR) 10 MG tablet Take 10 mg by mouth daily.     chlorthalidone (HYGROTON) 25 MG tablet Take 25 mg by mouth daily.     diclofenac (VOLTAREN) 75 MG EC tablet Take 75 mg by mouth 2 (two) times daily.   1   EPINEPHrine 0.3 mg/0.3 mL IJ SOAJ injection Inject 0.3 mg into the muscle daily as needed (for anaphylactic reactions.).      fluticasone (FLONASE) 50 MCG/ACT nasal spray Place 2 sprays into both nostrils daily.     gabapentin (NEURONTIN) 300 MG capsule Take 1 capsule (300 mg total) by mouth 4 (four) times daily as needed. 320 capsule 11   loratadine (CLARITIN) 10 MG tablet Take 10 mg by mouth daily.     Nebivolol HCl (BYSTOLIC) 20 MG TABS Take 20 mg by mouth daily.      omeprazole (PRILOSEC) 20 MG capsule Take 20 mg by mouth daily as needed (heartburn/indigestion.).     No current facility-administered medications for this visit.    PAST MEDICAL HISTORY: Past Medical History:  Diagnosis Date   Chronic back pain    Degenerative joint disease    GERD (gastroesophageal reflux disease)    Hypertension    PONV (postoperative nausea and vomiting)    Seasonal allergies     PAST SURGICAL HISTORY: Past Surgical History:  Procedure Laterality Date   ANAL FISTULOTOMY N/A 10/29/2014   Procedure: ANAL FISTULOTOMY;  Surgeon: Franky Macho, MD;  Location: [facility];  Service: General;  Laterality: N/A;   COLONOSCOPY N/A 01/06/2013   Procedure: COLONOSCOPY;  Surgeon: Dalia Heading, MD;  Location: [facility];  Service: Gastroenterology;  Laterality: N/A;   COLONOSCOPY N/A 11/12/2017   Procedure: COLONOSCOPY;  Surgeon: Franky Macho, MD;  Location: [facility];  Service: Gastroenterology;  Laterality: N/A;   COLONOSCOPY WITH PROPOFOL N/A 08/28/2022   Procedure: COLONOSCOPY WITH PROPOFOL;  Surgeon: Franky Macho, MD;  Location: [facility];  Service: Gastroenterology;  Laterality: N/A;   INCISION AND DRAINAGE ABSCESS N/A 09/18/2013   Procedure: INCISION AND DRAINAGE PERI-RECTAL ABSCESS;  Surgeon: Dalia Heading, MD;  Location: [facility];  Service: General;  Laterality: N/A;   KNEE ARTHROSCOPY Bilateral    PLANTAR FASCIA SURGERY Right    REPAIR TENDONS FOOT     SHOULDER ARTHROSCOPY Bilateral    WRIST ARTHROSCOPY Right    torn ligament    FAMILY HISTORY: Family History  Problem Relation Age of Onset   Hypertension Mother    Colon cancer Mother    Cancer Father     SOCIAL HISTORY: Social History   Socioeconomic History   Marital status: Married    Spouse name: Not on file   Number of children: Not on file   Years of education: Not on file   Highest education level: Not on file  Occupational History   Not on file  Tobacco Use   Smoking status: Never    Passive exposure: Never   Smokeless tobacco: Never  Vaping Use  Vaping status: Never Used  Substance and Sexual Activity   Alcohol use: Yes    Comment: seldom   Drug use: No   Sexual activity: Yes    Birth control/protection: None  Other Topics Concern   Not on file  Social History Narrative   Not on file   Social Determinants of Health   Financial Resource Strain: Not on file  Food Insecurity: Not on file  Transportation Needs: Not on file  Physical Activity: Not on file  Stress: Not on file  Social Connections: Not on file  Intimate Partner Violence: Not on file      Levert Feinstein, M.D. Ph.D.  [facility] [address] [place], [place] [address] Ph: [phone] Fax: [phone]  CC:  Benita Stabile, MD [address] [place],  [place] [address]  Benita Stabile, MD

## 2022-11-29 ENCOUNTER — Ambulatory Visit: Payer: No Typology Code available for payment source | Admitting: Neurology

## 2022-12-04 LAB — MULTIPLE MYELOMA PANEL, SERUM
Albumin SerPl Elph-Mcnc: 4.4 g/dL (ref 2.9–4.4)
Albumin/Glob SerPl: 1.5 (ref 0.7–1.7)
Alpha 1: 0.3 g/dL (ref 0.0–0.4)
Alpha2 Glob SerPl Elph-Mcnc: 0.9 g/dL (ref 0.4–1.0)
B-Globulin SerPl Elph-Mcnc: 1.2 g/dL (ref 0.7–1.3)
Gamma Glob SerPl Elph-Mcnc: 0.8 g/dL (ref 0.4–1.8)
Globulin, Total: 3.1 g/dL (ref 2.2–3.9)
IgA/Immunoglobulin A, Serum: 91 mg/dL (ref 61–437)
IgG (Immunoglobin G), Serum: 896 mg/dL (ref 603–1613)
IgM (Immunoglobulin M), Srm: 39 mg/dL (ref 20–172)
Total Protein: 7.5 g/dL (ref 6.0–8.5)

## 2022-12-04 LAB — TSH: TSH: 1.03 u[IU]/mL (ref 0.450–4.500)

## 2022-12-04 LAB — VITAMIN D 25 HYDROXY (VIT D DEFICIENCY, FRACTURES): Vit D, 25-Hydroxy: 21.8 ng/mL — ABNORMAL LOW (ref 30.0–100.0)

## 2022-12-04 LAB — HGB A1C W/O EAG: Hgb A1c MFr Bld: 5.8 % — ABNORMAL HIGH (ref 4.8–5.6)

## 2022-12-04 LAB — ANA W/REFLEX IF POSITIVE: Anti Nuclear Antibody (ANA): NEGATIVE

## 2022-12-04 LAB — RPR: RPR Ser Ql: NONREACTIVE

## 2022-12-04 LAB — VITAMIN B12: Vitamin B-12: 407 pg/mL (ref 232–1245)

## 2022-12-04 LAB — CK: Total CK: 366 U/L — ABNORMAL HIGH (ref 41–331)

## 2022-12-04 LAB — FERRITIN: Ferritin: 194 ng/mL (ref 30–400)

## 2022-12-04 LAB — SEDIMENTATION RATE: Sed Rate: 2 mm/h (ref 0–30)

## 2022-12-04 LAB — C-REACTIVE PROTEIN: CRP: 1 mg/L (ref 0–10)

## 2022-12-04 LAB — FOLATE: Folate: 9.2 ng/mL (ref >3.0)

## 2022-12-04 NOTE — Progress Notes (Signed)
EMG nerve conduction study report is under procedure tab 

## 2022-12-19 ENCOUNTER — Telehealth: Payer: Self-pay | Admitting: Neurology

## 2022-12-19 DIAGNOSIS — G629 Polyneuropathy, unspecified: Secondary | ICD-10-CM

## 2022-12-19 MED ORDER — NORTRIPTYLINE HCL 25 MG PO CAPS: ORAL_CAPSULE | ORAL | 11 refills | Status: DC

## 2022-12-19 NOTE — Addendum Note (Signed)
Addended by: Levert Feinstein on: 12/19/2022 02:30 PM   Modules accepted: Orders

## 2022-12-19 NOTE — Telephone Encounter (Signed)
It is Ok to stop cymbalta.  Meds ordered this encounter  Medications   nortriptyline (PAMELOR) 25 MG capsule    Sig: One po at bedtime xone week, then 2 qhs    Dispense:  60 capsule    Refill:  11

## 2022-12-19 NOTE — Telephone Encounter (Signed)
Returned call to pt who stated that he feels weird and doesn't have control over urination, food taste terrible, lost 12 lbs in 3 days, feels like its him but not him ,caused more cramps all over. Pt stated he stopped taking it and stated that he felt feeling better. He would like to know if there is something similar he can take.

## 2022-12-19 NOTE — Telephone Encounter (Signed)
Patient called stating he cannot take Cymbalta he feels like he is not the same person and was wondering if he can take something else instead.

## 2022-12-19 NOTE — Telephone Encounter (Signed)
Relayed msg to pt that Dr. Terrace Arabia is ok w/stopin cymbalta and would like to know if there is something else that he can take in place of the cymbalta

## 2023-02-21 ENCOUNTER — Ambulatory Visit: Payer: 59 | Admitting: Podiatry

## 2023-02-21 ENCOUNTER — Encounter: Payer: Self-pay | Admitting: Podiatry

## 2023-02-21 DIAGNOSIS — L84 Corns and callosities: Secondary | ICD-10-CM | POA: Diagnosis not present

## 2023-02-21 DIAGNOSIS — M2042 Other hammer toe(s) (acquired), left foot: Secondary | ICD-10-CM | POA: Diagnosis not present

## 2023-02-21 DIAGNOSIS — L600 Ingrowing nail: Secondary | ICD-10-CM

## 2023-02-22 NOTE — Progress Notes (Signed)
 Subjective:   Patient ID: [name], male   DOB: 63 y.o.   MRN: [id number]   HPI Patient presents stating that been having a lot of pain in the fourth toe of his left foot and it can burn at times it gets very irritated and at times can be numb.  Patient does not remember specific injury    ROS      Objective:  Physical Exam  Neurovascular status intact with the patient found to have rotated fourth digit of the left foot with distal lateral keratotic lesion 1 on the third toe and also damaged fourth nailbed with incurvation of the lateral side.  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  I do think the digital structure is a part of this problem and it also may involve the nail or strictly the toe corn or both.  Reviewed all this with him and did discuss ultimate derotational arthroplasty but at this time debrided lesions no iatrogenic bleeding reappoint routine care

## 2023-05-30 ENCOUNTER — Ambulatory Visit: Payer: 59 | Admitting: Neurology

## 2023-05-30 ENCOUNTER — Encounter: Payer: Self-pay | Admitting: Neurology

## 2023-05-30 VITALS — BP 129/79 | HR 61 | Ht 69.0 in | Wt 199.5 lb

## 2023-05-30 DIAGNOSIS — G629 Polyneuropathy, unspecified: Secondary | ICD-10-CM

## 2023-05-30 DIAGNOSIS — R252 Cramp and spasm: Secondary | ICD-10-CM

## 2023-05-30 DIAGNOSIS — M545 Low back pain, unspecified: Secondary | ICD-10-CM

## 2023-05-30 MED ORDER — NORTRIPTYLINE HCL 25 MG PO CAPS: 75.0000 mg | ORAL_CAPSULE | Freq: Every day | ORAL | 3 refills | Status: DC

## 2023-05-30 NOTE — Progress Notes (Signed)
 ASSESSMENT AND PLAN  [name] is a 63 y.o. male   Low extremity muscle cramp Restless leg syndrome  EMG nerve conduction study confirmed peripheral neuropathy, with evidence of mild distal axonal loss,  Known history of lumbar degenerative disease, not a surgical candidate, under the care of pain management receiving epidural injection.  His symptoms are also suggestive of restless leg syndrome  Improved with higher dose of gabapentin  300 mg 4 tablets every night and adding long nortriptyline  25 mg 2 tablets every night, still has occasionally muscle cramping, higher dose of nortriptyline  up to 75 mg every night  Could not tolerate Cymbalta , loss of appetite, taste bud,  Return To Clinic With NP In 12 Months   DIAGNOSTIC DATA (LABS, IMAGING, TESTING) - I reviewed patient records, labs, notes, testing and imaging myself where available.   MEDICAL HISTORY:  [name] is a 63 year old male, seen in request by his primary care physician Dr. Del Favia, Lethia Raveling, for slow worsening lower extremity muscle cramps, initial evaluation was on September 10, 2022  I reviewed and summarized the referring note.  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, he noticed gradual worsening bilateral lower extremity muscle cramping, initially only intermittently, now 5 days out of the week he will be waking up very painful large muscle cramping involving bilateral lower extremity, usually adductor muscles, sometimes calf muscles, posterior thigh muscles, very painful, he has tried multiple different over-the-counter method, mustard, pickle juice, cream without helping, muscle relaxants such as methocarbamol, NSAIDs, was started on gabapentin  300 mg 2 tablets every night for a while, it was helpful but not loses benefit  He has chronic low back pain, denies  significant radiating pain, having MRI of lumbar pending, receiving epidural injection every few months,  UPDATE Nov 28 2022: Electrodiagnostic study today demonstrate evidence of sensorimotor polyneuropathy, with evidence of distal axonal loss, there is also evidence of moderate right carpal tunnel syndromes.  There is no evidence of active bilateral lumbosacral radiculopathy.  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  PHYSICAL EXAMNIATION:  Gen: NAD, conversant, well nourised, well groomed                     Cardiovascular: Regular rate rhythm, no peripheral edema, warm, nontender. Eyes: Conjunctivae clear without exudates or hemorrhage Neck: Supple, no carotid bruits. Pulmonary: Clear to auscultation bilaterally   NEUROLOGICAL EXAM:  MENTAL STATUS: Speech/cognition: Awake, alert, oriented to history  taking and casual conversation CRANIAL NERVES: CN II: Visual fields are full to confrontation. Pupils are round equal and briskly reactive to light. CN III, IV, VI: extraocular movement are normal. No ptosis. CN V: Facial sensation is  intact to light touch CN VII: Face is symmetric with normal eye closure  CN VIII: Hearing is normal to causal conversation. CN IX, X: Phonation is normal. CN XI: Head turning and shoulder shrug are intact  MOTOR: Normal bilateral upper and lower extremity proximal and distal strength  REFLEXES: Reflexes are 1 and symmetric at the biceps, triceps, knees, and absent at ankles. Plantar responses are flexor.  SENSORY: Intact to light touch, pinprick and vibratory sensation are intact in fingers and toes.  COORDINATION: There is no trunk or limb dysmetria noted.  GAIT/STANCE: Posture is normal. Gait is steady   REVIEW OF SYSTEMS:  Full 14 system review of systems performed and notable only for as above All other review of systems were negative.   ALLERGIES: Allergies  Allergen Reactions   Citrullus Vulgaris Anaphylaxis, Other (See Comments) and Swelling    watermelon  Other Reaction(s): Angioedema (ALLERGY/intolerance), Other (See Comments)    watermelon   Cucumber Extract Anaphylaxis   Other Anaphylaxis    cantaloupe    HOME MEDICATIONS: Current Outpatient Medications  Medication Sig Dispense Refill   amLODipine (NORVASC) 2.5 MG tablet Take 2.5 mg by mouth daily.     atorvastatin (LIPITOR) 10 MG tablet Take 10 mg by mouth daily.     chlorthalidone (HYGROTON) 25 MG tablet Take 25 mg by mouth daily.     diclofenac (VOLTAREN) 75 MG EC tablet Take 75 mg by mouth 2 (two) times daily.   1   EPINEPHrine  0.3 mg/0.3 mL IJ SOAJ injection Inject 0.3 mg into the muscle daily as needed (for anaphylactic reactions.).      fluticasone (FLONASE) 50 MCG/ACT nasal spray Place 2 sprays into both nostrils daily.     gabapentin  (NEURONTIN ) 300 MG capsule Take 1 capsule (300 mg total) by mouth 4 (four) times daily as needed. 320 capsule 11   loratadine (CLARITIN) 10 MG tablet Take 10 mg by mouth daily.     Nebivolol HCl (BYSTOLIC) 20 MG TABS Take 20 mg by mouth daily.      nortriptyline   (PAMELOR ) 25 MG capsule One po at bedtime xone week, then 2 qhs 60 capsule 11   omeprazole (PRILOSEC) 20 MG capsule Take 20 mg by mouth daily as needed (heartburn/indigestion.).     No current facility-administered medications for this visit.    PAST MEDICAL HISTORY: Past Medical History:  Diagnosis Date   Chronic back pain    Degenerative joint disease    GERD (gastroesophageal reflux disease)    Hypertension    PONV (postoperative nausea and vomiting)    Seasonal allergies     PAST SURGICAL HISTORY: Past Surgical History:  Procedure Laterality Date   ANAL FISTULOTOMY N/A 10/29/2014   Procedure: ANAL FISTULOTOMY;  Surgeon: Alanda Allegra, MD;  Location: [facility];  Service: General;  Laterality: N/A;   COLONOSCOPY N/A 01/06/2013   Procedure: COLONOSCOPY;  Surgeon: Beau Bound, MD;  Location: [facility];  Service: Gastroenterology;  Laterality: N/A;   COLONOSCOPY N/A 11/12/2017   Procedure: COLONOSCOPY;  Surgeon: Alanda Allegra, MD;  Location: [facility];  Service: Gastroenterology;  Laterality: N/A;   COLONOSCOPY WITH PROPOFOL  N/A 08/28/2022   Procedure: COLONOSCOPY WITH PROPOFOL ;  Surgeon: Alanda Allegra, MD;  Location: [facility];  Service: Gastroenterology;  Laterality: N/A;   INCISION AND DRAINAGE ABSCESS N/A 09/18/2013   Procedure: INCISION AND DRAINAGE PERI-RECTAL ABSCESS;  Surgeon: Beau Bound, MD;  Location: [facility];  Service: General;  Laterality: N/A;   KNEE ARTHROSCOPY Bilateral    PLANTAR FASCIA SURGERY Right    REPAIR TENDONS FOOT     SHOULDER ARTHROSCOPY Bilateral    WRIST ARTHROSCOPY Right    torn ligament    FAMILY HISTORY: Family History  Problem Relation Age of Onset   Hypertension Mother    Colon cancer Mother    Cancer Father     SOCIAL HISTORY: Social History   Socioeconomic History   Marital status: Married    Spouse name: Not on file   Number of children: Not on file   Years of education: Not on file   Highest education level: Not on  file  Occupational History   Not on file  Tobacco Use   Smoking status: Never    Passive exposure: Never   Smokeless tobacco: Never  Vaping Use   Vaping status: Never Used  Substance and Sexual Activity   Alcohol use: Yes    Comment: seldom   Drug use: No   Sexual activity: Yes    Birth control/protection: None  Other Topics Concern   Not on file  Social History Narrative   Not on file   Social Drivers of Health   Financial Resource Strain: Not on file  Food Insecurity: Low Risk  (11/29/2022)   Received from [facility]   Hunger Vital Sign    Worried About Running Out of Food in the Last Year: Never true    Ran Out of Food in the Last Year: Never true  Transportation Needs: No Transportation Needs (11/29/2022)   Received from [facility]    In the past 12 months, has lack of reliable transportation kept you from medical appointments, meetings, work or from getting things needed for daily living? : No  Physical Activity: Not on file  Stress: Not on file  Social Connections: Not on file  Intimate Partner Violence: Not on file      Phebe Brasil, M.D. Ph.D.  [facility] [address] [place], [place] [address] Ph: [phone] Fax: [phone]  CC:  Omie Bickers, MD [address] [place],  [place] [address]  Omie Bickers, MD

## 2023-07-17 DIAGNOSIS — R200 Anesthesia of skin: Secondary | ICD-10-CM | POA: Diagnosis not present

## 2023-07-17 DIAGNOSIS — R202 Paresthesia of skin: Secondary | ICD-10-CM | POA: Diagnosis not present

## 2023-07-17 DIAGNOSIS — M19032 Primary osteoarthritis, left wrist: Secondary | ICD-10-CM | POA: Diagnosis not present

## 2023-08-13 DIAGNOSIS — M47816 Spondylosis without myelopathy or radiculopathy, lumbar region: Secondary | ICD-10-CM | POA: Diagnosis not present

## 2023-08-14 DIAGNOSIS — R202 Paresthesia of skin: Secondary | ICD-10-CM | POA: Diagnosis not present

## 2023-08-14 DIAGNOSIS — G5612 Other lesions of median nerve, left upper limb: Secondary | ICD-10-CM | POA: Diagnosis not present

## 2023-08-14 DIAGNOSIS — M19032 Primary osteoarthritis, left wrist: Secondary | ICD-10-CM | POA: Diagnosis not present

## 2023-08-14 DIAGNOSIS — R200 Anesthesia of skin: Secondary | ICD-10-CM | POA: Diagnosis not present

## 2023-09-10 ENCOUNTER — Encounter (INDEPENDENT_AMBULATORY_CARE_PROVIDER_SITE_OTHER): Payer: Self-pay | Admitting: Neurology

## 2023-09-10 DIAGNOSIS — R252 Cramp and spasm: Secondary | ICD-10-CM | POA: Diagnosis not present

## 2023-09-10 DIAGNOSIS — G2581 Restless legs syndrome: Secondary | ICD-10-CM

## 2023-09-12 DIAGNOSIS — M47816 Spondylosis without myelopathy or radiculopathy, lumbar region: Secondary | ICD-10-CM | POA: Diagnosis not present

## 2023-09-12 MED ORDER — NORTRIPTYLINE HCL 50 MG PO CAPS: 100.0000 mg | ORAL_CAPSULE | Freq: Every day | ORAL | 11 refills | Status: DC

## 2023-09-12 NOTE — Addendum Note (Signed)
 Addended by: Lavonta Tillis on: 09/12/2023 08:39 AM   Modules accepted: Orders

## 2023-09-12 NOTE — Telephone Encounter (Signed)
 Please see the MyChart message reply(ies) for my assessment and plan.    This patient gave consent for this Medical Advice Message and is aware that it may result in a bill to [employer], as well as the possibility of receiving a bill for a co-payment or deductible. They are an established patient, but are not seeking medical advice exclusively about a problem treated during an in person or video visit in the last seven days. I did not recommend an in person or video visit within seven days of my reply.    I spent a total of 5 minutes cumulative time within 7 days through [employer].  Levert Feinstein, MD

## 2023-10-24 DIAGNOSIS — M47816 Spondylosis without myelopathy or radiculopathy, lumbar region: Secondary | ICD-10-CM | POA: Diagnosis not present

## 2023-10-24 DIAGNOSIS — M533 Sacrococcygeal disorders, not elsewhere classified: Secondary | ICD-10-CM | POA: Diagnosis not present

## 2023-10-24 DIAGNOSIS — M545 Low back pain, unspecified: Secondary | ICD-10-CM | POA: Diagnosis not present

## 2023-10-24 DIAGNOSIS — G8929 Other chronic pain: Secondary | ICD-10-CM | POA: Diagnosis not present

## 2023-11-04 DIAGNOSIS — M533 Sacrococcygeal disorders, not elsewhere classified: Secondary | ICD-10-CM | POA: Diagnosis not present

## 2023-11-06 DIAGNOSIS — I10 Essential (primary) hypertension: Secondary | ICD-10-CM | POA: Diagnosis not present

## 2023-11-06 DIAGNOSIS — R7303 Prediabetes: Secondary | ICD-10-CM | POA: Diagnosis not present

## 2023-11-13 DIAGNOSIS — R748 Abnormal levels of other serum enzymes: Secondary | ICD-10-CM | POA: Diagnosis not present

## 2023-11-13 DIAGNOSIS — Z0001 Encounter for general adult medical examination with abnormal findings: Secondary | ICD-10-CM | POA: Diagnosis not present

## 2023-11-13 DIAGNOSIS — Z6830 Body mass index (BMI) 30.0-30.9, adult: Secondary | ICD-10-CM | POA: Diagnosis not present

## 2023-11-13 DIAGNOSIS — E669 Obesity, unspecified: Secondary | ICD-10-CM | POA: Diagnosis not present

## 2023-11-13 DIAGNOSIS — Z23 Encounter for immunization: Secondary | ICD-10-CM | POA: Diagnosis not present

## 2023-11-13 DIAGNOSIS — E782 Mixed hyperlipidemia: Secondary | ICD-10-CM | POA: Diagnosis not present

## 2023-11-13 DIAGNOSIS — I10 Essential (primary) hypertension: Secondary | ICD-10-CM | POA: Diagnosis not present

## 2023-11-14 ENCOUNTER — Encounter (INDEPENDENT_AMBULATORY_CARE_PROVIDER_SITE_OTHER): Payer: Self-pay | Admitting: Neurology

## 2023-11-14 DIAGNOSIS — R252 Cramp and spasm: Secondary | ICD-10-CM | POA: Diagnosis not present

## 2023-11-14 DIAGNOSIS — G2581 Restless legs syndrome: Secondary | ICD-10-CM

## 2023-11-14 DIAGNOSIS — M545 Low back pain, unspecified: Secondary | ICD-10-CM

## 2023-11-14 NOTE — Telephone Encounter (Signed)
Called, left message for him to call back.

## 2023-11-19 ENCOUNTER — Other Ambulatory Visit: Payer: Self-pay | Admitting: Neurology

## 2023-11-19 MED ORDER — CLONAZEPAM 0.5 MG PO TABS: 0.5000 mg | ORAL_TABLET | Freq: Every evening | ORAL | 5 refills | Status: AC | PRN

## 2023-11-19 NOTE — Addendum Note (Signed)
 Addended by: Deeana Atwater on: 11/19/2023 11:21 AM   Modules accepted: Orders

## 2023-11-19 NOTE — Telephone Encounter (Signed)
Please see the MyChart message reply(ies) for my assessment and plan.    This patient gave consent for this Medical Advice Message and is aware that it may result in a bill to [employer], as well as the possibility of receiving a bill for a co-payment or deductible. They are an established patient, but are not seeking medical advice exclusively about a problem treated during an in person or video visit in the last seven days. I did not recommend an in person or video visit within seven days of my reply.    I spent a total of 15 minutes cumulative time within 7 days through [employer].  Marcial Pacas, MD

## 2023-11-19 NOTE — Telephone Encounter (Signed)
 Last seen on 05/30/23 Follow up scheduled on 06/04/24

## 2023-11-19 NOTE — Telephone Encounter (Signed)
 I called patient, reported recurrent leg muscle cramping since August, despite continued on nortriptyline  50 mg 2 tablets every night, gabapentin  300 mg 4 times a day as needed, denies significant side effect,  Painful leg muscle cramping sometimes involving anterior shin, mostly happen before he goes to bed, but half of the time wake him up from sleep, due to severe muscle cramps, occasionally during daytime  He denies significant gait abnormality, did have a history of chronic low back pain, is receiving epidural injection from outside clinic,  I have ordered MRI of the lumbar, add on low-dose 0.5 mg every night as needed   Orders Placed This Encounter  Procedures   MR Lumbar Spine W Wo Contrast      Meds ordered this encounter  Medications   clonazePAM (KLONOPIN) 0.5 MG tablet    Sig: Take 1 tablet (0.5 mg total) by mouth at bedtime as needed for anxiety.    Dispense:  30 tablet    Refill:  5    Do not refill in less than 30 days     Move up his follow-up with Lauraine after MRI

## 2023-11-27 ENCOUNTER — Ambulatory Visit (INDEPENDENT_AMBULATORY_CARE_PROVIDER_SITE_OTHER): Payer: Self-pay | Admitting: Neurology

## 2023-11-27 ENCOUNTER — Ambulatory Visit

## 2023-11-27 DIAGNOSIS — R252 Cramp and spasm: Secondary | ICD-10-CM | POA: Diagnosis not present

## 2023-11-27 DIAGNOSIS — M545 Low back pain, unspecified: Secondary | ICD-10-CM | POA: Diagnosis not present

## 2023-11-27 DIAGNOSIS — G2581 Restless legs syndrome: Secondary | ICD-10-CM

## 2023-11-27 MED ORDER — GADOBENATE DIMEGLUMINE 529 MG/ML IV SOLN
20.0000 mL | Freq: Once | INTRAVENOUS | Status: AC | PRN
  Administered 2023-11-27: 20 mL via INTRAVENOUS

## 2023-12-02 DIAGNOSIS — R1310 Dysphagia, unspecified: Secondary | ICD-10-CM | POA: Diagnosis not present

## 2023-12-04 ENCOUNTER — Encounter: Payer: Self-pay | Admitting: Neurology

## 2023-12-04 DIAGNOSIS — R200 Anesthesia of skin: Secondary | ICD-10-CM | POA: Diagnosis not present

## 2023-12-04 DIAGNOSIS — M19032 Primary osteoarthritis, left wrist: Secondary | ICD-10-CM | POA: Diagnosis not present

## 2023-12-04 DIAGNOSIS — R202 Paresthesia of skin: Secondary | ICD-10-CM | POA: Diagnosis not present

## 2023-12-05 MED ORDER — ROPINIROLE HCL 1 MG PO TABS: 2.0000 mg | ORAL_TABLET | Freq: Three times a day (TID) | ORAL | 6 refills | Status: AC

## 2023-12-05 NOTE — Telephone Encounter (Signed)
Please see the MyChart message reply(ies) for my assessment and plan.    This patient gave consent for this Medical Advice Message and is aware that it may result in a bill to [employer], as well as the possibility of receiving a bill for a co-payment or deductible. They are an established patient, but are not seeking medical advice exclusively about a problem treated during an in person or video visit in the last seven days. I did not recommend an in person or video visit within seven days of my reply.    I spent a total of 12 minutes cumulative time within 7 days through [employer].  Levert Feinstein, MD

## 2023-12-05 NOTE — Telephone Encounter (Signed)
 I called patient, he is now taking at 9pm  Nortriptyline  50mg  x 2 tabs Gabapentin  300mg  x4 tabs Clonazepam 0.5mg  xone tab  He can fall into sleep well, then woke up  with thigh and leg muscle cramps, very painful, he has to wake up 2-6 times each night due to leg muscle cramps.  He has to stretch and walk, taking 10-15 minutes to alleviated the muscle cramp.  Meds ordered this encounter  Medications   rOPINIRole (REQUIP) 1 MG tablet    Sig: Take 2 tablets (2 mg total) by mouth 3 (three) times daily.    Dispense:  60 tablet    Refill:  6     He is to take Requip 1mg  2 tabs  Once woke up gabapentin  300mg  x 2-4 tabs Taper off nortriptyline , 50mg  at bedtime xone week, then stop Only use clonazepam 0.5mg  as needed

## 2023-12-05 NOTE — Addendum Note (Signed)
 Addended by: Kitrina Maurin on: 12/05/2023 12:23 PM   Modules accepted: Orders

## 2023-12-17 DIAGNOSIS — M545 Low back pain, unspecified: Secondary | ICD-10-CM | POA: Diagnosis not present

## 2023-12-17 DIAGNOSIS — G8929 Other chronic pain: Secondary | ICD-10-CM | POA: Diagnosis not present

## 2023-12-25 DIAGNOSIS — M545 Low back pain, unspecified: Secondary | ICD-10-CM | POA: Diagnosis not present

## 2023-12-25 DIAGNOSIS — G8929 Other chronic pain: Secondary | ICD-10-CM | POA: Diagnosis not present

## 2023-12-31 ENCOUNTER — Telehealth: Payer: Self-pay | Admitting: Nurse Practitioner

## 2023-12-31 NOTE — Telephone Encounter (Signed)
 Received a referral in the fax que for  leg cramps, Provider reviewed- we do not see for that, should it have been something else? I left a VM for Sabrina at Dr Darlyn Shilling office.

## 2024-01-13 DIAGNOSIS — X32XXXD Exposure to sunlight, subsequent encounter: Secondary | ICD-10-CM | POA: Diagnosis not present

## 2024-01-13 DIAGNOSIS — L570 Actinic keratosis: Secondary | ICD-10-CM | POA: Diagnosis not present

## 2024-02-21 ENCOUNTER — Encounter: Payer: Self-pay | Admitting: Gastroenterology

## 2024-03-02 ENCOUNTER — Ambulatory Visit: Admitting: Internal Medicine

## 2024-04-07 ENCOUNTER — Ambulatory Visit: Admitting: Gastroenterology

## 2024-06-01 ENCOUNTER — Ambulatory Visit: Admitting: Neurology

## 2024-06-04 ENCOUNTER — Ambulatory Visit: Admitting: Neurology
